# Patient Record
Sex: Female | Born: 1960 | Race: White | Hispanic: No | Smoking: Current every day smoker
Health system: Southern US, Community
[De-identification: ages and names within clinical notes are randomized; demographics above are authoritative.]

## PROBLEM LIST (undated history)

## (undated) DIAGNOSIS — I10 Essential (primary) hypertension: Secondary | ICD-10-CM

## (undated) DIAGNOSIS — R0602 Shortness of breath: Secondary | ICD-10-CM

## (undated) DIAGNOSIS — F32A Depression, unspecified: Secondary | ICD-10-CM

## (undated) DIAGNOSIS — I219 Acute myocardial infarction, unspecified: Secondary | ICD-10-CM

## (undated) DIAGNOSIS — F329 Major depressive disorder, single episode, unspecified: Secondary | ICD-10-CM

## (undated) DIAGNOSIS — I639 Cerebral infarction, unspecified: Secondary | ICD-10-CM

## (undated) DIAGNOSIS — J40 Bronchitis, not specified as acute or chronic: Secondary | ICD-10-CM

## (undated) DIAGNOSIS — E7800 Pure hypercholesterolemia, unspecified: Secondary | ICD-10-CM

## (undated) DIAGNOSIS — G43909 Migraine, unspecified, not intractable, without status migrainosus: Secondary | ICD-10-CM

---
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
## 1980-08-02 HISTORY — PX: DILATION AND CURETTAGE OF UTERUS: SHX78

## 2010-09-24 DIAGNOSIS — I639 Cerebral infarction, unspecified: Secondary | ICD-10-CM

## 2010-09-24 HISTORY — DX: Cerebral infarction, unspecified: I63.9

## 2011-09-03 DIAGNOSIS — I219 Acute myocardial infarction, unspecified: Secondary | ICD-10-CM

## 2011-09-03 HISTORY — DX: Acute myocardial infarction, unspecified: I21.9

## 2011-09-26 DIAGNOSIS — I214 Non-ST elevation (NSTEMI) myocardial infarction: Secondary | ICD-10-CM

## 2011-09-26 DIAGNOSIS — R7989 Other specified abnormal findings of blood chemistry: Secondary | ICD-10-CM

## 2011-09-27 ENCOUNTER — Encounter (HOSPITAL_COMMUNITY): Payer: Self-pay | Admitting: Pharmacy Technician

## 2011-09-27 DIAGNOSIS — I219 Acute myocardial infarction, unspecified: Secondary | ICD-10-CM

## 2011-09-27 HISTORY — PX: CARDIAC CATHETERIZATION: SHX172

## 2011-09-28 ENCOUNTER — Ambulatory Visit (HOSPITAL_COMMUNITY)
Admission: RE | Admit: 2011-09-28 | Discharge: 2011-09-29 | Disposition: A | Discharge status: Home / Self Care | Payer: Self-pay | Source: Ambulatory Visit | Attending: Cardiology | Admitting: Cardiology

## 2011-09-28 ENCOUNTER — Encounter (HOSPITAL_COMMUNITY): Payer: Self-pay | Admitting: General Practice

## 2011-09-28 ENCOUNTER — Inpatient Hospital Stay (HOSPITAL_COMMUNITY)
Admission: RE | Admit: 2011-09-28 | Payer: Self-pay | Source: Other Acute Inpatient Hospital | Admitting: Cardiovascular Disease

## 2011-09-28 ENCOUNTER — Encounter (HOSPITAL_COMMUNITY)
Admission: RE | Disposition: A | Discharge status: Home / Self Care | Payer: Self-pay | Source: Ambulatory Visit | Attending: Cardiology

## 2011-09-28 ENCOUNTER — Other Ambulatory Visit: Payer: Self-pay

## 2011-09-28 DIAGNOSIS — I2510 Atherosclerotic heart disease of native coronary artery without angina pectoris: Secondary | ICD-10-CM

## 2011-09-28 DIAGNOSIS — I214 Non-ST elevation (NSTEMI) myocardial infarction: Secondary | ICD-10-CM | POA: Insufficient documentation

## 2011-09-28 DIAGNOSIS — D72829 Elevated white blood cell count, unspecified: Secondary | ICD-10-CM | POA: Insufficient documentation

## 2011-09-28 DIAGNOSIS — I5181 Takotsubo syndrome: Secondary | ICD-10-CM | POA: Insufficient documentation

## 2011-09-28 DIAGNOSIS — I959 Hypotension, unspecified: Secondary | ICD-10-CM | POA: Insufficient documentation

## 2011-09-28 DIAGNOSIS — F17200 Nicotine dependence, unspecified, uncomplicated: Secondary | ICD-10-CM | POA: Insufficient documentation

## 2011-09-28 DIAGNOSIS — N179 Acute kidney failure, unspecified: Secondary | ICD-10-CM | POA: Insufficient documentation

## 2011-09-28 DIAGNOSIS — Z720 Tobacco use: Secondary | ICD-10-CM

## 2011-09-28 DIAGNOSIS — I10 Essential (primary) hypertension: Secondary | ICD-10-CM | POA: Insufficient documentation

## 2011-09-28 DIAGNOSIS — Z8673 Personal history of transient ischemic attack (TIA), and cerebral infarction without residual deficits: Secondary | ICD-10-CM | POA: Insufficient documentation

## 2011-09-28 DIAGNOSIS — E860 Dehydration: Secondary | ICD-10-CM | POA: Insufficient documentation

## 2011-09-28 DIAGNOSIS — G43909 Migraine, unspecified, not intractable, without status migrainosus: Secondary | ICD-10-CM

## 2011-09-28 HISTORY — DX: Essential (primary) hypertension: I10

## 2011-09-28 HISTORY — DX: Depression, unspecified: F32.A

## 2011-09-28 HISTORY — DX: Migraine, unspecified, not intractable, without status migrainosus: G43.909

## 2011-09-28 HISTORY — DX: Bronchitis, not specified as acute or chronic: J40

## 2011-09-28 HISTORY — DX: Major depressive disorder, single episode, unspecified: F32.9

## 2011-09-28 HISTORY — PX: LEFT HEART CATHETERIZATION WITH CORONARY ANGIOGRAM: SHX5451

## 2011-09-28 HISTORY — DX: Shortness of breath: R06.02

## 2011-09-28 HISTORY — DX: Acute myocardial infarction, unspecified: I21.9

## 2011-09-28 HISTORY — DX: Cerebral infarction, unspecified: I63.9

## 2011-09-28 HISTORY — DX: Pure hypercholesterolemia, unspecified: E78.00

## 2011-09-28 LAB — CBC
Hemoglobin: 11.5 g/dL — ABNORMAL LOW (ref 12.0–15.0)
MCHC: 34.1 g/dL (ref 30.0–36.0)
Platelets: 260 10*3/uL (ref 150–400)
RBC: 3.96 MIL/uL (ref 3.87–5.11)

## 2011-09-28 LAB — CREATININE, SERUM
Creatinine, Ser: 0.85 mg/dL (ref 0.50–1.10)
GFR calc non Af Amer: 79 mL/min — ABNORMAL LOW (ref >90)

## 2011-09-28 SURGERY — LEFT HEART CATHETERIZATION WITH CORONARY ANGIOGRAM
Anesthesia: LOCAL

## 2011-09-28 MED ORDER — ALPRAZOLAM 0.25 MG PO TABS: 0.2500 mg | ORAL_TABLET | Freq: Two times a day (BID) | ORAL | Status: DC | PRN

## 2011-09-28 MED ORDER — LIDOCAINE HCL (PF) 1 % IJ SOLN
INTRAMUSCULAR | Status: AC
  Filled 2011-09-28: qty 30

## 2011-09-28 MED ORDER — ZOLPIDEM TARTRATE 5 MG PO TABS
10.0000 mg | ORAL_TABLET | Freq: Every evening | ORAL | Status: DC | PRN
  Administered 2011-09-28: 10 mg via ORAL
  Filled 2011-09-28: qty 2

## 2011-09-28 MED ORDER — HEPARIN SODIUM (PORCINE) 5000 UNIT/ML IJ SOLN
5000.0000 [IU] | Freq: Three times a day (TID) | INTRAMUSCULAR | Status: DC
  Filled 2011-09-28 (×5): qty 1

## 2011-09-28 MED ORDER — HYDROCODONE-ACETAMINOPHEN 5-325 MG PO TABS
1.0000 | ORAL_TABLET | Freq: Four times a day (QID) | ORAL | Status: DC | PRN
  Administered 2011-09-28 (×2): 1 via ORAL
  Filled 2011-09-28 (×2): qty 1

## 2011-09-28 MED ORDER — FENTANYL CITRATE 0.05 MG/ML IJ SOLN
INTRAMUSCULAR | Status: AC
  Filled 2011-09-28: qty 2

## 2011-09-28 MED ORDER — HEPARIN (PORCINE) IN NACL 2-0.9 UNIT/ML-% IJ SOLN
INTRAMUSCULAR | Status: AC
  Filled 2011-09-28: qty 2000

## 2011-09-28 MED ORDER — KETOROLAC TROMETHAMINE 30 MG/ML IJ SOLN
30.0000 mg | Freq: Once | INTRAMUSCULAR | Status: AC
  Administered 2011-09-28: 30 mg via INTRAVENOUS
  Filled 2011-09-28: qty 1

## 2011-09-28 MED ORDER — ACETAMINOPHEN 325 MG PO TABS
650.0000 mg | ORAL_TABLET | ORAL | Status: DC | PRN
  Administered 2011-09-29: 650 mg via ORAL
  Filled 2011-09-28: qty 2

## 2011-09-28 MED ORDER — NITROGLYCERIN 0.2 MG/ML ON CALL CATH LAB
INTRAVENOUS | Status: AC
  Filled 2011-09-28: qty 1

## 2011-09-28 MED ORDER — ASPIRIN 81 MG PO CHEW
81.0000 mg | CHEWABLE_TABLET | Freq: Every day | ORAL | Status: DC
  Administered 2011-09-29: 81 mg via ORAL
  Filled 2011-09-28: qty 1

## 2011-09-28 MED ORDER — ATORVASTATIN CALCIUM 10 MG PO TABS
20.0000 mg | ORAL_TABLET | Freq: Every day | ORAL | Status: DC
  Administered 2011-09-28: 20 mg via ORAL
  Filled 2011-09-28 (×2): qty 2

## 2011-09-28 MED ORDER — ONDANSETRON HCL 4 MG/2ML IJ SOLN: 4.0000 mg | Freq: Four times a day (QID) | INTRAMUSCULAR | Status: DC | PRN

## 2011-09-28 MED ORDER — VERAPAMIL HCL 2.5 MG/ML IV SOLN
INTRAVENOUS | Status: AC
  Filled 2011-09-28: qty 2

## 2011-09-28 MED ORDER — SODIUM CHLORIDE 0.9 % IV SOLN
INTRAVENOUS | Status: AC
Start: 2011-09-28 — End: 2011-09-28
  Administered 2011-09-28: 15:00:00 via INTRAVENOUS

## 2011-09-28 MED ORDER — ACETAMINOPHEN 325 MG PO TABS: 650.0000 mg | ORAL_TABLET | ORAL | Status: DC | PRN

## 2011-09-28 MED ORDER — HYDROCODONE-ACETAMINOPHEN 5-325 MG PO TABS: 1.0000 | ORAL_TABLET | Freq: Four times a day (QID) | ORAL | Status: DC

## 2011-09-28 MED ORDER — ACETAMINOPHEN 325 MG PO TABS
ORAL_TABLET | ORAL | Status: AC
  Filled 2011-09-28: qty 2

## 2011-09-28 MED ORDER — POTASSIUM CHLORIDE CRYS ER 20 MEQ PO TBCR
20.0000 meq | EXTENDED_RELEASE_TABLET | Freq: Two times a day (BID) | ORAL | Status: DC
  Administered 2011-09-28 – 2011-09-29 (×3): 20 meq via ORAL
  Filled 2011-09-28 (×4): qty 1

## 2011-09-28 MED ORDER — CARVEDILOL 6.25 MG PO TABS
6.2500 mg | ORAL_TABLET | Freq: Two times a day (BID) | ORAL | Status: DC
  Administered 2011-09-28 – 2011-09-29 (×2): 6.25 mg via ORAL
  Filled 2011-09-28 (×4): qty 1

## 2011-09-28 MED ORDER — HEPARIN SODIUM (PORCINE) 1000 UNIT/ML IJ SOLN
INTRAMUSCULAR | Status: AC
  Filled 2011-09-28: qty 1

## 2011-09-28 MED ORDER — LISINOPRIL 2.5 MG PO TABS
2.5000 mg | ORAL_TABLET | Freq: Every day | ORAL | Status: DC
  Administered 2011-09-29: 2.5 mg via ORAL
  Filled 2011-09-28 (×2): qty 1

## 2011-09-28 MED ORDER — MIDAZOLAM HCL 2 MG/2ML IJ SOLN
INTRAMUSCULAR | Status: AC
  Filled 2011-09-28: qty 2

## 2011-09-28 NOTE — Procedures (Addendum)
   Cardiac Catheterization Procedure Note  Name: Ashley Arroyo MRN: [id number] DOB: 1961-02-13  Procedure: Left Heart Cath, Selective Coronary Angiography, LV angiography  Indication: NSTEMI   Procedural Details: Allen's test was positive.  The right wrist was prepped, draped, and anesthetized with 1% lidocaine. Using the modified Seldinger technique, a 5 French sheath was introduced into the right radial artery. 3 mg of verapamil was administered through the sheath, weight-based unfractionated heparin was administered intravenously. Standard Judkins catheters were used for selective coronary angiography and left ventriculography. Catheter exchanges were performed over an exchange length guidewire. There were no immediate procedural complications. A TR band was used for radial hemostasis at the completion of the procedure.  The patient was transferred to the post catheterization recovery area for further monitoring.  Procedural Findings: Hemodynamics: AO 159/89 LV 155/15  Coronary angiography: Coronary dominance: right  Left mainstem: Mild distal LM tapering.   Left anterior descending (LAD): 30% proximal stenosis, serial 30% mid LAD stenoses.   Left circumflex (LCx): Small ramus, large OM1.  30% proximal LCx stenosis.   Right coronary artery (RCA): 30% proximal and 40% mid RCA stenosis.   Left ventriculography: Left ventricular systolic function is mild to moderately reduced, EF 40-45%.  Global hypokinesis.  No mitral regurgitation.  Final Conclusions:  Nonobstructive CAD.  EF 40-45% with mild global hypokinesis.   Recommendations:  Nonischemic cardiomyopathy.  EF improved compared to echo report from [place].  ? If this could be a Takotsubo-type response to receiving epinephrine at [facility] at hospital admission (for stridor and hypotension).  Probable discharge tomorrow if stable.  Will repeat ECG to make sure that QT interval remains normal.   Marca Ancona 09/28/2011, 12:41  PM

## 2011-09-28 NOTE — H&P (Signed)
   Please see written progress note today from Dr. Kirke Corin at [place].  No changes to H&P since that time.  Proceeding with left heart cath.   Marca Ancona 09/28/2011

## 2011-09-29 ENCOUNTER — Other Ambulatory Visit: Payer: Self-pay

## 2011-09-29 DIAGNOSIS — I10 Essential (primary) hypertension: Secondary | ICD-10-CM

## 2011-09-29 DIAGNOSIS — I214 Non-ST elevation (NSTEMI) myocardial infarction: Secondary | ICD-10-CM

## 2011-09-29 DIAGNOSIS — N179 Acute kidney failure, unspecified: Secondary | ICD-10-CM

## 2011-09-29 DIAGNOSIS — Z720 Tobacco use: Secondary | ICD-10-CM

## 2011-09-29 DIAGNOSIS — I5181 Takotsubo syndrome: Secondary | ICD-10-CM

## 2011-09-29 LAB — CBC
Hemoglobin: 11.1 g/dL — ABNORMAL LOW (ref 12.0–15.0)
MCH: 28.6 pg (ref 26.0–34.0)
MCHC: 33.5 g/dL (ref 30.0–36.0)
Platelets: 252 10*3/uL (ref 150–400)

## 2011-09-29 LAB — BASIC METABOLIC PANEL
BUN: 8 mg/dL (ref 6–23)
Calcium: 9.2 mg/dL (ref 8.4–10.5)
GFR calc Af Amer: >90 mL/min (ref >90)
GFR calc non Af Amer: 82 mL/min — ABNORMAL LOW (ref >90)
Glucose, Bld: 92 mg/dL (ref 70–99)
Potassium: 3.8 mEq/L (ref 3.5–5.1)
Sodium: 136 mEq/L (ref 135–145)

## 2011-09-29 LAB — MAGNESIUM: Magnesium: 2 mg/dL (ref 1.5–2.5)

## 2011-09-29 MED ORDER — LISINOPRIL 2.5 MG PO TABS: 2.5000 mg | ORAL_TABLET | Freq: Every day | ORAL | Status: DC

## 2011-09-29 MED ORDER — CARVEDILOL 6.25 MG PO TABS: 6.2500 mg | ORAL_TABLET | Freq: Two times a day (BID) | ORAL | Status: DC

## 2011-09-29 MED ORDER — PRAVASTATIN SODIUM 40 MG PO TABS: 40.0000 mg | ORAL_TABLET | Freq: Every day | ORAL | Status: DC

## 2011-09-29 NOTE — Progress Notes (Signed)
   CARE MANAGEMENT NOTE 09/29/2011  Patient:  MAZEY, MANTELL   Account Number:  [id number]  Date Initiated:  09/29/2011  Documentation initiated by:  Sharrie Rothman  Subjective/Objective Assessment:   Pt admitted with CP, S/P Cath. Pt being discharged today and needs assistance with medications and PCp set up. CM consulted for this assistance. Spoke with PT and has not seen PCP > 1 year.     Action/Plan:   Called [facility] and made follow up appt with Kizzie Furnish and also with Meriam Sprague about assistance with medications. Pt made aware of follow up appt and directions for med assistance.   Anticipated DC Date:  09/29/2011   Anticipated DC Plan:        DC Planning Services  CM consult      Choice offered to / List presented to:             Status of service:  Completed, signed off Medicare Important Message given?   (If response is "NO", the following Medicare IM given date fields will be blank) Date Medicare IM given:   Date Additional Medicare IM given:    Discharge Disposition:  HOME/SELF CARE  Per UR Regulation:    Comments:  09-29-11 1053 Arlyss Queen, RN BSN Care Management Appt made for March 4 at 815. Pt also ask for paperwork for medication assistance. Also called financial advisor to come speak with pt about help with hospital bill.
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
## 2011-09-29 NOTE — Progress Notes (Signed)
pts BP 170s/100s earlier this AM; 6.25mg  coreg given around 0730; BP recheck around 0930 was 100/69; pt to receive lisinopril, paged Jessica Hope,Pa and made aware of BP;  told to give some time and recheck BP in a few hours, if pressure ok, then give lisinopril; will continue to monitor; pt without any complaints

## 2011-09-29 NOTE — Progress Notes (Signed)
I have seen and examined the patient. I agree with the above note with the addition of : No chest pain or dyspnea. Cardiac cath noted. 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  Radial site is intact. Can discharge home today on current meds. Follow up in 2 weeks with me.   Lorine Bears 09/29/2011 1:32 PM

## 2011-09-29 NOTE — Consult Note (Signed)
Pt is a 1 ppd smoker who is very eager to quit. Recommended 21 mg patch x 6 weeks, 14 mg patch x 2 weeks and the 7 mg patch x 2 weeks. Discussed and wrote down patch use instructions for the pt. Referred to 1-800 quit now for f/u and support. Discussed oral fixation substitutes, second hand smoke and in home smoking policy. Reviewed and gave pt Written education/contact information.

## 2011-09-29 NOTE — Discharge Instructions (Signed)
Appt made for March 4 at 815. Pt also ask for paperwork for medication assistance at [facility] Dept [phone]. Please bring copy of all house income and discharge instructions to her appt.

## 2011-09-29 NOTE — Progress Notes (Signed)
BP this AM 175/105.  Patient c/o headache.  Stated she normally takes lisinopril at 8AM daily at home.  Paged PA, Lucile Crater to request AM BP-affecting meds be given early.  Await return call.  Headache treated with Tylenol 650mg  PO per current orders.  Continuing to monitor.

## 2011-09-29 NOTE — Progress Notes (Signed)
Cardiology Progress Note Patient Name: Ashley Arroyo Date of Encounter: 09/29/2011, 8:26 AM     Subjective  No overnight events. Patient feels well this morning without c/o headache, chest pain, or shortness of breath. She lives in [place] with her mother. Feels ready to go home.   Objective   Telemetry: Sinus rhythm 70s-90s  EKG 09/29/11 @ 0658 - NSR 77bpm, QTc 447, more pronounced T waves V4-V6  Medications: . aspirin  81 mg Oral Daily  . atorvastatin  20 mg Oral q1800  . carvedilol  6.25 mg Oral BID WC  . heparin  5,000 Units Subcutaneous Q8H  . ketorolac  30 mg Intravenous Once  . lisinopril  2.5 mg Oral Daily  . potassium chloride  20 mEq Oral BID   . sodium chloride 75 mL/hr at 09/28/11 1526    Physical Exam: Temp:  [98 F (36.7 C)-99.5 F (37.5 C)] 98.4 F (36.9 C) (02/27 0807) Pulse Rate:  [75-96] 75  (02/27 0807) Resp:  [16-22] 22  (02/27 0807) BP: (110-177)/(76-105) 110/76 mmHg (02/27 0807) SpO2:  [95 %-98 %] 95 % (02/27 0807) Weight:  [110 lb (49.896 kg)-118 lb (53.524 kg)] 118 lb (53.524 kg) (02/27 0500)  General: Middle-aged white female, in no acute distress. Head: Normocephalic, atraumatic, sclera non-icteric, nares are without discharge.  Neck: Supple. Negative for carotid bruits or JVD Lungs: Clear bilaterally to auscultation without wheezes, rales, or rhonchi. Breathing is unlabored. Heart: RRR S1 S2 w/ 3/6 systolic murmur best heard @ LUSB, No rubs or gallops.  Abdomen: Soft, non-tender, non-distended with normoactive bowel sounds. No rebound/guarding. No obvious abdominal masses. Msk:  Strength and tone appear normal for age. Extremities: Right wrist dressing dry/intact. No hematoma. No edema. No clubbing or cyanosis. Distal pedal pulses are 2+ and equal bilaterally. Neuro: Alert and oriented X 3. Moves all extremities spontaneously. Psych:  Responds to questions appropriately with a flat affect.   Labs:  [facility] 09/29/11 0620 09/28/11 1506    NA 136 --  K 3.8 --  CL 102 --  CO2 27 --  GLUCOSE 92 --  BUN 8 --  CREATININE 0.82 0.85  CALCIUM 9.2 --  MG 2.0 --   Basename 09/29/11 0620 09/28/11 1506  WBC 10.0 12.8*  HGB 11.1* 11.5*  HCT 33.1* 33.7*  MCV 85.3 85.1  PLT 252 260   Radiology/Studies:   09/28/11 - Left Heart Cath, Selective Coronary Angiography, LV angiography Hemodynamics:  AO 159/89  LV 155/15  Coronary angiography:  Coronary dominance: right  Left mainstem: Mild distal LM tapering.  Left anterior descending (LAD): 30% proximal stenosis, serial 30% mid LAD stenoses.  Left circumflex (LCx): Small ramus, large OM1. 30% proximal LCx stenosis.  Right coronary artery (RCA): 30% proximal and 40% mid RCA stenosis.  Left ventriculography: Left ventricular systolic function is mild to moderately reduced, EF 40-45%. Global hypokinesis. No mitral regurgitation.  Final Conclusions: Nonobstructive CAD. EF 40-45% with mild global hypokinesis.  Recommendations: Nonischemic cardiomyopathy. EF improved compared to echo report from [place]. ? If this could be a Takotsubo-type response to receiving epinephrine at [facility] at hospital admission (for stridor and hypotension). Probable discharge tomorrow if stable. Will repeat ECG to make sure that QT interval remains normal.     Assessment and Plan  51 y.o. female w/ PMHx significant for tobacco abuse, HTN, TIA, and migraines who presented to [facility] for headache, vomiting, and weakness, found to have low EF on echo and ruled in for NSTEMI  for which she was transferred to [facility] on 09/28/11 for cardiac cath.   1. NSTEMI: Elevated cardiac enzymes initially felt 2/2 hypoperfusion in the setting of hypotension and vomiting. EF was noted to be 20-25% by echo at [facility], but 40-45% by cath here. Cardiac cath revealed nonobstructive CAD with mild global hypokinesis. There is question if this could be a Takotsubo-type response to receiving epinephrine at  [facility] for stridor. She denies chest pain. EKG this am is without any acute ischemic changes. QTc 447. Cont ASA, BB, statin.  2. Hypertension: She was initially hypotensive at [facility] and home antihypertensives were held. BPs have been elevated here with SBP 160-170 overnight. She takes Lisinopril 40mg  and Norvasc 5mh at home. Lisinopril 2.5mg  was added this morning. Last BP 110/76. Monitor BP and consider uptitration of ACEI. Cont Coreg  3. Leukocytosis: Resolved. WBC improved to 10.0 this am. Tmax 99.5  4. Acute kidney injury: Resolved. BUN/Crt improved to 8/0.82 this am  Disposition: MD to assess timing of DC. Note she is self pay and will need generic medications if possible.  Signed, Clatie Kessen PA-C

## 2011-09-29 NOTE — Progress Notes (Signed)
D/c orders received; ivs removed with gauze on; pt remains in stable condition; pt meds and instructions reviewed and given to pt; pt reminded of health department meeting on 10/04/11 for medication assistance; pt without any questions, pt d/c home

## 2011-09-30 ENCOUNTER — Other Ambulatory Visit: Payer: Self-pay | Admitting: Cardiology

## 2011-09-30 NOTE — Telephone Encounter (Signed)
Looks like this is Dr Jari Sportsman pt in [place].

## 2011-09-30 NOTE — Telephone Encounter (Signed)
Please advise 

## 2011-09-30 NOTE — Telephone Encounter (Signed)
Alprazolam was not called into [facility] [place], pt was in hospital and this one was the only one not called in

## 2011-09-30 NOTE — Telephone Encounter (Signed)
FU Call: pt calling to check on status of refill request for alprazolam 0.25 mg to [facility] in [place]

## 2011-09-30 NOTE — Telephone Encounter (Signed)
Pt states medications were changed while in the hospital and the Xanax was the only thing not changed. She is aware that Dr. Kirke Corin will need to review and approve before any prescriptions can be sent for Xanax. She is also aware that we generally have primary MD manage these types of medications.

## 2011-10-01 MED ORDER — ALPRAZOLAM 0.25 MG PO TABS: 0.2500 mg | ORAL_TABLET | Freq: Three times a day (TID) | ORAL | Status: DC | PRN

## 2011-10-01 NOTE — Telephone Encounter (Signed)
I can only provide her with 30 tablets without refills until she sees her PCP.

## 2011-10-01 NOTE — Telephone Encounter (Signed)
Pt aware.

## 2011-10-12 ENCOUNTER — Encounter: Payer: Self-pay | Admitting: Cardiovascular Disease

## 2011-10-12 ENCOUNTER — Ambulatory Visit (INDEPENDENT_AMBULATORY_CARE_PROVIDER_SITE_OTHER): Payer: Self-pay | Admitting: Cardiovascular Disease

## 2011-10-12 VITALS — BP 145/80 | HR 71 | Ht 67.0 in | Wt 113.0 lb

## 2011-10-12 DIAGNOSIS — Z720 Tobacco use: Secondary | ICD-10-CM

## 2011-10-12 DIAGNOSIS — F17200 Nicotine dependence, unspecified, uncomplicated: Secondary | ICD-10-CM

## 2011-10-12 DIAGNOSIS — I10 Essential (primary) hypertension: Secondary | ICD-10-CM

## 2011-10-12 DIAGNOSIS — I5181 Takotsubo syndrome: Secondary | ICD-10-CM

## 2011-10-12 MED ORDER — LISINOPRIL 10 MG PO TABS: 10.0000 mg | ORAL_TABLET | Freq: Every day | ORAL | Status: DC

## 2011-10-12 NOTE — Patient Instructions (Signed)
Your physician wants you to follow-up in: 4 months. You will receive a reminder letter in the mail one-two months in advance. If you don't receive a letter, please call our office to schedule the follow-up appointment. Increase Lisinopril to 10 mg daily. You may take 4 of your 2.5 mg tablets daily until gone, and then get new prescription filled for 10 mg tablets. A new prescription was sent to your pharmacy to reflect this change.

## 2011-10-20 ENCOUNTER — Telehealth: Payer: Self-pay | Admitting: *Deleted

## 2011-10-20 ENCOUNTER — Other Ambulatory Visit: Payer: Self-pay | Admitting: Cardiovascular Disease

## 2011-10-20 NOTE — Telephone Encounter (Signed)
Pt left message on voicemail stating she was seen on the 12th by Dr. Kirke Corin. She states 3 days later she was trying to move something and had a pain around her heart. She had pain for 2 days.   Pt states she went to a friend of her's to help her do some cleaning. She had moved something before she had had a heart pain in her chest on her (L) side. She states then she went to another friends house after that and had a sharp pain again in her chest. She states yesterday we was walking around and she thinks she went too far and got tired.  She states she has been out of work for a year b/c of the [employer] and she lost some of her strength. She states she was trying to get on disability, but also was thinking about going back to work. She didn't know what her limitations were. Pt notified that whoever treated her stroke should notify her regarding limitations from her stroke and disability.   Pt aware a note will be sent to Dr. Kirke Corin regarding chest pain. Pt is aware to go to the ER for continued or worsening chest pain.

## 2011-10-21 NOTE — Assessment & Plan Note (Signed)
I discussed with her the importance of smoking cessation. 

## 2011-10-21 NOTE — Telephone Encounter (Signed)
I don't think her chest pain is related to her heart condition. Her cardiac cath showed no significant CAD. She should follow up with her PCP to make her BP is not running high.

## 2011-10-21 NOTE — Assessment & Plan Note (Signed)
The patient had what seems to be stress-induced cardiomyopathy which was either induced by her physical illness or due to epinephrine. There are well described cases of stress-induced cardiomyopathy after receiving epinephrine. These cases usually resolves quickly and many of them are associated with significant QT prolongation similar to her situation. Her ejection fraction significantly improved from 25% on echo 40-45% on cath. She had no significant coronary artery disease. I recommend continuing treatment with Coreg. Her blood pressure has been running elevated and thus I will increase the dose of lisinopril to 10 mg once daily. She is to followup in 4 months.

## 2011-10-21 NOTE — Assessment & Plan Note (Signed)
Lisinopril will be increased today. I prefer to maximize treatment with Coreg and lisinopril and if possible weaning her off clonidine.
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
## 2011-10-22 NOTE — Telephone Encounter (Signed)
Pt notified and verbalized understanding.

## 2011-12-07 ENCOUNTER — Telehealth: Payer: Self-pay | Admitting: *Deleted

## 2011-12-07 NOTE — Telephone Encounter (Signed)
Pt left message on voicemail asking for a return call.  Pt states she has an MI in Feb. She states she is now having pain in the glands in her neck, like where the arteries are. They have been hurting since last Friday. She states every now and then she feels she can't get her breath and has to take a deep breath. She states she also has trouble breathing at night when she lays down. She denies chest pain, pressure or tightness. She denies sore throat, runny nose or other allergy/cold/sinus symptoms.   Pt notified to proceed to ER for further evaluation. Pt verbalized understanding.

## 2012-02-14 ENCOUNTER — Encounter: Payer: Self-pay | Admitting: Cardiology

## 2012-02-14 ENCOUNTER — Ambulatory Visit (INDEPENDENT_AMBULATORY_CARE_PROVIDER_SITE_OTHER): Payer: Self-pay | Admitting: Cardiology

## 2012-02-14 VITALS — BP 162/106 | HR 72 | Ht 67.0 in | Wt 107.0 lb

## 2012-02-14 DIAGNOSIS — F17200 Nicotine dependence, unspecified, uncomplicated: Secondary | ICD-10-CM

## 2012-02-14 DIAGNOSIS — I214 Non-ST elevation (NSTEMI) myocardial infarction: Secondary | ICD-10-CM

## 2012-02-14 DIAGNOSIS — I10 Essential (primary) hypertension: Secondary | ICD-10-CM

## 2012-02-14 DIAGNOSIS — Z720 Tobacco use: Secondary | ICD-10-CM

## 2012-02-14 DIAGNOSIS — I5181 Takotsubo syndrome: Secondary | ICD-10-CM

## 2012-02-14 MED ORDER — LISINOPRIL 20 MG PO TABS: 20.0000 mg | ORAL_TABLET | Freq: Every day | ORAL | Status: DC
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
## 2012-02-14 NOTE — Patient Instructions (Addendum)
Your physician recommends that you schedule a follow-up appointment in: August 2013 with Dr. Antoine Poche.  Your physician has requested that you have an echocardiogram in August 2013. Echocardiography is a painless test that uses sound waves to create images of your heart. It provides your doctor with information about the size and shape of your heart and how well your heart's chambers and valves are working. This procedure takes approximately one hour. There are no restrictions for this procedure.   Your physician has recommended you make the following change in your medication: increase lisinopril to 20 mg daily. You may take 2 of your 10 mg tablets until finished. Your new prescription has been sent to your pharmacy.   Your physician recommends that you return for lab work 02/28/12 @ [facility].

## 2012-03-01 ENCOUNTER — Encounter: Payer: Self-pay | Admitting: *Deleted

## 2012-03-08 ENCOUNTER — Other Ambulatory Visit: Payer: Self-pay

## 2012-03-08 ENCOUNTER — Other Ambulatory Visit (INDEPENDENT_AMBULATORY_CARE_PROVIDER_SITE_OTHER): Payer: Self-pay

## 2012-03-08 DIAGNOSIS — I5181 Takotsubo syndrome: Secondary | ICD-10-CM

## 2012-03-13 ENCOUNTER — Ambulatory Visit (INDEPENDENT_AMBULATORY_CARE_PROVIDER_SITE_OTHER): Payer: Self-pay | Admitting: Cardiology

## 2012-03-13 ENCOUNTER — Encounter: Payer: Self-pay | Admitting: Cardiology

## 2012-03-13 VITALS — BP 120/80 | HR 88 | Ht 67.0 in | Wt 105.5 lb

## 2012-03-13 DIAGNOSIS — I214 Non-ST elevation (NSTEMI) myocardial infarction: Secondary | ICD-10-CM

## 2012-03-13 DIAGNOSIS — I10 Essential (primary) hypertension: Secondary | ICD-10-CM

## 2012-03-13 DIAGNOSIS — Z720 Tobacco use: Secondary | ICD-10-CM

## 2012-03-13 DIAGNOSIS — I5181 Takotsubo syndrome: Secondary | ICD-10-CM

## 2012-03-13 DIAGNOSIS — F17200 Nicotine dependence, unspecified, uncomplicated: Secondary | ICD-10-CM

## 2012-03-13 MED ORDER — CARVEDILOL 12.5 MG PO TABS: 12.5000 mg | ORAL_TABLET | Freq: Two times a day (BID) | ORAL | Status: DC

## 2012-03-13 NOTE — Patient Instructions (Addendum)
Your physician recommends that you schedule a follow-up appointment in: 3 months with Dr. Antoine Poche. Your physician has recommended you make the following change in your medication:increase carvedilol to 12.5 mg twice daily. You may take 2 of your 6.25 mg twice daily until they finish. Your new prescription has been sent to your pharmacy.
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
## 2012-03-16 ENCOUNTER — Telehealth: Payer: Self-pay | Admitting: *Deleted

## 2012-03-16 NOTE — Telephone Encounter (Signed)
Message copied by Eustace Moore on Thu Mar 16, 2012  4:48 PM ------      Message from: Rollene Rotunda      Created: Sat Mar 11, 2012  4:12 PM       EF is better than the initial echocardiogram and unchanged from previous.  Mildly reduced EF. Continue with medical management.  Call Ms. Muldrow with the results and send results to MUSE,ROCHELLE D., PA

## 2012-03-16 NOTE — Telephone Encounter (Signed)
Left message with mother of patient to have patient call office.

## 2012-03-17 ENCOUNTER — Telehealth: Payer: Self-pay | Admitting: *Deleted

## 2012-03-17 NOTE — Telephone Encounter (Signed)
Patient informed and copy sent to [facility]

## 2012-03-17 NOTE — Telephone Encounter (Signed)
Message copied by Eustace Moore on Fri Mar 17, 2012  9:29 AM ------      Message from: Rollene Rotunda      Created: Sat Mar 11, 2012  4:12 PM       EF is better than the initial echocardiogram and unchanged from previous.  Mildly reduced EF. Continue with medical management.  Call Ms. Rutan with the results and send results to MUSE,ROCHELLE D., PA

## 2012-03-17 NOTE — Telephone Encounter (Signed)
Patient informed. 

## 2012-05-05 ENCOUNTER — Other Ambulatory Visit (HOSPITAL_COMMUNITY): Payer: Self-pay | Admitting: Cardiology

## 2012-06-13 ENCOUNTER — Encounter: Payer: Self-pay | Admitting: Cardiology

## 2012-06-13 ENCOUNTER — Ambulatory Visit (INDEPENDENT_AMBULATORY_CARE_PROVIDER_SITE_OTHER): Payer: Self-pay | Admitting: Cardiology

## 2012-06-13 VITALS — BP 158/112 | HR 75 | Ht 67.0 in | Wt 105.8 lb

## 2012-06-13 DIAGNOSIS — F17200 Nicotine dependence, unspecified, uncomplicated: Secondary | ICD-10-CM

## 2012-06-13 DIAGNOSIS — I5181 Takotsubo syndrome: Secondary | ICD-10-CM

## 2012-06-13 DIAGNOSIS — I10 Essential (primary) hypertension: Secondary | ICD-10-CM

## 2012-06-13 DIAGNOSIS — Z720 Tobacco use: Secondary | ICD-10-CM

## 2012-06-13 DIAGNOSIS — I214 Non-ST elevation (NSTEMI) myocardial infarction: Secondary | ICD-10-CM

## 2012-06-13 MED ORDER — CARVEDILOL 25 MG PO TABS: 25.0000 mg | ORAL_TABLET | Freq: Two times a day (BID) | ORAL | Status: DC
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
## 2012-06-13 NOTE — Patient Instructions (Addendum)
Your physician recommends that you schedule a follow-up appointment in: 2 months. Your physician has recommended you make the following change in your medication: Increased carvedilol to 25 mg twice daily. You may take (2) of your 12.5 mg twice daily until they are finished then start your new prescription. Your new prescription has been sent to your pharmacy. All other medications will remain the same.

## 2012-06-25 ENCOUNTER — Other Ambulatory Visit: Payer: Self-pay | Admitting: Cardiology

## 2012-08-07 ENCOUNTER — Ambulatory Visit (INDEPENDENT_AMBULATORY_CARE_PROVIDER_SITE_OTHER): Payer: Self-pay | Admitting: Cardiology

## 2012-08-07 ENCOUNTER — Encounter: Payer: Self-pay | Admitting: Cardiology

## 2012-08-07 VITALS — BP 115/74 | HR 74 | Ht 67.0 in | Wt 108.0 lb

## 2012-08-07 DIAGNOSIS — I214 Non-ST elevation (NSTEMI) myocardial infarction: Secondary | ICD-10-CM

## 2012-08-07 DIAGNOSIS — I10 Essential (primary) hypertension: Secondary | ICD-10-CM

## 2012-08-07 DIAGNOSIS — Z720 Tobacco use: Secondary | ICD-10-CM

## 2012-08-07 DIAGNOSIS — I5181 Takotsubo syndrome: Secondary | ICD-10-CM

## 2012-08-07 DIAGNOSIS — F17200 Nicotine dependence, unspecified, uncomplicated: Secondary | ICD-10-CM

## 2012-08-07 MED ORDER — PRAVASTATIN SODIUM 40 MG PO TABS: 40.0000 mg | ORAL_TABLET | Freq: Every day | ORAL | Status: DC

## 2012-08-07 NOTE — Patient Instructions (Addendum)
Your physician recommends that you schedule a follow-up appointment in: 1 year. You will receive a reminder letter in the mail in about 10 months reminding you to call and schedule your appointment. If you don't receive this letter, please contact our office. Your physician recommends that you continue on your current medications as directed. Please refer to the Current Medication list given to you today.  Your physician recommends that you return for a FASTING lipid profile in 8 months at [facility]. You will receive a reminder letter in the mail in about 6 months reminding you to have this completed. If you don't receive this letter, please contact our office.
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
## 2012-08-10 ENCOUNTER — Telehealth: Payer: Self-pay | Admitting: *Deleted

## 2012-08-10 NOTE — Telephone Encounter (Signed)
Recommend generic Lipitor 40 daily. Pt had an LDL of 134, back in 09/2011. She would then need a f/u FLP/LFT profile 12 weeks after starting Rx.

## 2012-08-10 NOTE — Telephone Encounter (Signed)
Patient called to let our office know that she can't afford the pravastatin since its no longer on $4 list. Nurse informed patient that different pharmacies would be contacted for price checks. [facility] has simvastatin available for $4. Can patient be switched to this. Patient said she can't afford $15 a month.

## 2012-08-11 NOTE — Telephone Encounter (Signed)
Called to verify information at [facility] and prices online aren't up to date. Per pharmacist #[id number] pravastatin 40 mg is $24.30 per month.

## 2012-08-11 NOTE — Telephone Encounter (Signed)
Pravastatin is available at [facility] for $15/3 months

## 2012-08-14 MED ORDER — SIMVASTATIN 20 MG PO TABS: 20.0000 mg | ORAL_TABLET | Freq: Every day | ORAL | Status: DC

## 2012-08-14 NOTE — Telephone Encounter (Signed)
Patient informed. 

## 2012-08-14 NOTE — Telephone Encounter (Signed)
Per Gene, patient to start on simvastatin 20 mg daily and repeat fasting labs as directed by Hochrein.

## 2012-08-14 NOTE — Telephone Encounter (Signed)
I recommend that she take whatever is most affordable for her. When she finds this, she can contact us and we'll call in prescription for her.

## 2012-08-15 ENCOUNTER — Other Ambulatory Visit: Payer: Self-pay | Admitting: *Deleted

## 2012-08-15 MED ORDER — SIMVASTATIN 20 MG PO TABS: 20.0000 mg | ORAL_TABLET | Freq: Every day | ORAL | Status: DC

## 2013-01-16 ENCOUNTER — Other Ambulatory Visit: Payer: Self-pay | Admitting: *Deleted

## 2013-01-16 DIAGNOSIS — Z13220 Encounter for screening for lipoid disorders: Secondary | ICD-10-CM

## 2013-01-16 NOTE — Progress Notes (Signed)
Order mailed to home address for fasting lipid lab.

## 2013-05-07 ENCOUNTER — Other Ambulatory Visit: Payer: Self-pay | Admitting: *Deleted

## 2013-05-07 MED ORDER — LISINOPRIL 20 MG PO TABS: 20.0000 mg | ORAL_TABLET | Freq: Every day | ORAL | Status: DC

## 2013-06-20 ENCOUNTER — Telehealth: Payer: Self-pay | Admitting: *Deleted

## 2013-06-20 DIAGNOSIS — Z79899 Other long term (current) drug therapy: Secondary | ICD-10-CM

## 2013-06-20 DIAGNOSIS — E785 Hyperlipidemia, unspecified: Secondary | ICD-10-CM

## 2013-06-20 NOTE — Telephone Encounter (Signed)
Spoke with patient and she informed nurse that she hasn't taken simvastatin since March 2014. Please advise if patient need to still increase dosage or restart simvastatin 20 mg.

## 2013-06-20 NOTE — Telephone Encounter (Signed)
Message copied by Eustace Moore on Wed Jun 20, 2013  1:47 PM ------      Message from: FLEMING, PAMELA J      Created: Mon Jun 04, 2013  2:18 PM                   ----- Message -----         From: Rollene Rotunda, MD         Sent: 06/03/2013   6:02 PM           To: Rocco Serene, RN            The patient can only afford Zocor.  Increase dose to 40 mg daily.  Call Ms. Bartles with the results and send results to MUSE,ROCHELLE D., PA-C             ------

## 2013-06-22 NOTE — Telephone Encounter (Signed)
Restart at 20 mg daily.  Repeat lipid and liver in 10 weeks.

## 2013-06-25 MED ORDER — SIMVASTATIN 20 MG PO TABS: 20.0000 mg | ORAL_TABLET | Freq: Every day | ORAL | Status: DC

## 2013-06-25 NOTE — Telephone Encounter (Signed)
Patient informed. Patient requesting to get lab work orders during her upcoming appointment on 08/13/13.

## 2013-07-09 ENCOUNTER — Other Ambulatory Visit: Payer: Self-pay | Admitting: *Deleted

## 2013-07-09 MED ORDER — CARVEDILOL 25 MG PO TABS: 25.0000 mg | ORAL_TABLET | Freq: Two times a day (BID) | ORAL | Status: DC

## 2013-07-16 ENCOUNTER — Other Ambulatory Visit: Payer: Self-pay | Admitting: *Deleted

## 2013-07-16 MED ORDER — CARVEDILOL 25 MG PO TABS: 25.0000 mg | ORAL_TABLET | Freq: Two times a day (BID) | ORAL | Status: DC

## 2013-08-13 ENCOUNTER — Encounter: Payer: Self-pay | Admitting: Cardiology

## 2013-08-13 ENCOUNTER — Ambulatory Visit (INDEPENDENT_AMBULATORY_CARE_PROVIDER_SITE_OTHER): Payer: Self-pay | Admitting: Cardiology

## 2013-08-13 VITALS — BP 94/61 | HR 71 | Ht 67.0 in | Wt 103.0 lb

## 2013-08-13 DIAGNOSIS — I10 Essential (primary) hypertension: Secondary | ICD-10-CM

## 2013-08-13 DIAGNOSIS — Z79899 Other long term (current) drug therapy: Secondary | ICD-10-CM

## 2013-08-13 DIAGNOSIS — I5181 Takotsubo syndrome: Secondary | ICD-10-CM

## 2013-08-13 DIAGNOSIS — E785 Hyperlipidemia, unspecified: Secondary | ICD-10-CM
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
## 2013-08-13 NOTE — Patient Instructions (Signed)
Your physician recommends that you schedule a follow-up appointment in: 1 year with Dr. Wyline MoodBranch. You should receive a letter in the mail in 10 months. If you do not receive this letter by November 2015  call our office to schedule this appointment.   Your physician recommends that you continue on your current medications as directed. Please refer to the Current Medication list given to you today.  Your physician recommends that you return for lab work in: today .   You can go to the following locations to get lab work done: [facility] [address] [place]. [place] [facility].

## 2013-08-23 ENCOUNTER — Telehealth: Payer: Self-pay | Admitting: *Deleted

## 2013-08-23 NOTE — Telephone Encounter (Signed)
Message copied by Eustace MooreANDERSON, Haden Suder M on Thu Aug 23, 2013  8:52 AM ------      Message from: Pickensville[place]ANCH, JONATHAN F      Created: Wed Aug 22, 2013 10:42 AM       Please let patient know that cholesterol looks good            Dina RichJonathan Branch MD ------

## 2013-08-23 NOTE — Telephone Encounter (Signed)
Patient informed. 

## 2013-10-03 ENCOUNTER — Encounter: Payer: Self-pay | Admitting: Cardiology

## 2013-12-28 ENCOUNTER — Other Ambulatory Visit: Payer: Self-pay | Admitting: *Deleted

## 2013-12-28 MED ORDER — LISINOPRIL 20 MG PO TABS: 20.0000 mg | ORAL_TABLET | Freq: Every day | ORAL | Status: DC

## 2014-02-19 ENCOUNTER — Telehealth: Payer: Self-pay | Admitting: Cardiology

## 2014-02-19 NOTE — Telephone Encounter (Signed)
Unclear whats causing her symptoms. From a cardiovascular standpoint does not sound consistent with circulation problems, it sounds more nerve related. Potenitally a neuropathy or lumbar back issue. This would need to get worked up by pcp first.  Ashley FerryJ Tanith Dagostino MD

## 2014-02-19 NOTE — Telephone Encounter (Signed)
Advised patient to see her PMD regarding these issues.  Stated that she did not have the money to pay for a visit at the health department.  Stated that she did have patient assistance with [facility].  Patient questioned how she could find out more regarding patient assistance with [facility].  Phone number provided.  Again, explained to her that this may not be a cardiac related issue.  Will send to MD for advice.

## 2014-02-20 NOTE — Telephone Encounter (Signed)
Patient notified

## 2014-07-11 ENCOUNTER — Encounter (HOSPITAL_COMMUNITY): Payer: Self-pay | Admitting: Cardiology

## 2014-07-31 ENCOUNTER — Other Ambulatory Visit: Payer: Self-pay | Admitting: Cardiology

## 2014-08-01 ENCOUNTER — Other Ambulatory Visit: Payer: Self-pay | Admitting: *Deleted

## 2014-08-01 MED ORDER — CARVEDILOL 25 MG PO TABS: 25.0000 mg | ORAL_TABLET | Freq: Two times a day (BID) | ORAL | Status: DC

## 2014-08-01 MED ORDER — LISINOPRIL 20 MG PO TABS: 20.0000 mg | ORAL_TABLET | Freq: Every day | ORAL | Status: DC

## 2014-08-05 ENCOUNTER — Other Ambulatory Visit: Payer: Self-pay | Admitting: *Deleted

## 2014-08-05 MED ORDER — CARVEDILOL 25 MG PO TABS: 25.0000 mg | ORAL_TABLET | Freq: Two times a day (BID) | ORAL | Status: DC

## 2014-08-05 MED ORDER — LISINOPRIL 20 MG PO TABS: 20.0000 mg | ORAL_TABLET | Freq: Every day | ORAL | Status: DC

## 2014-08-12 ENCOUNTER — Encounter: Payer: Self-pay | Admitting: Cardiology

## 2014-08-12 ENCOUNTER — Ambulatory Visit (INDEPENDENT_AMBULATORY_CARE_PROVIDER_SITE_OTHER): Payer: Self-pay | Admitting: Cardiology

## 2014-08-12 VITALS — BP 114/89 | HR 92 | Ht 67.0 in | Wt 106.0 lb

## 2014-08-12 DIAGNOSIS — R0602 Shortness of breath: Secondary | ICD-10-CM

## 2014-08-12 DIAGNOSIS — E785 Hyperlipidemia, unspecified: Secondary | ICD-10-CM

## 2014-08-12 DIAGNOSIS — I5022 Chronic systolic (congestive) heart failure: Secondary | ICD-10-CM

## 2014-08-12 DIAGNOSIS — Z720 Tobacco use: Secondary | ICD-10-CM

## 2014-08-12 DIAGNOSIS — I10 Essential (primary) hypertension: Secondary | ICD-10-CM

## 2014-08-12 MED ORDER — CARVEDILOL 25 MG PO TABS: 25.0000 mg | ORAL_TABLET | Freq: Two times a day (BID) | ORAL | Status: DC

## 2014-08-12 NOTE — Patient Instructions (Addendum)
Your physician wants you to follow-up in: 6 months with Dr. Lurena JoinerBranch You will receive a reminder letter in the mail two months in advance. If you don't receive a letter, please call our office to schedule the follow-up appointment.  Your physician has requested that you have an echocardiogram. Echocardiography is a painless test that uses sound waves to create images of your heart. It provides your doctor with information about the size and shape of your heart and how well your heart's chambers and valves are working. This procedure takes approximately one hour. There are no restrictions for this procedure.  Your physician recommends that you continue on your current medications as directed. Please refer to the Current Medication list given to you today.  Your physician has recommended that you have a pulmonary function test. Pulmonary Function Tests are a group of tests that measure how well air moves in and out of your lungs.  WE HAVE GIVEN YOU A 30 DAY SUPPLY OF CARVEDILOL   Thank you for choosing  [facility]!!
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
## 2014-08-14 ENCOUNTER — Other Ambulatory Visit: Payer: Self-pay

## 2014-08-14 DIAGNOSIS — R0602 Shortness of breath: Secondary | ICD-10-CM

## 2014-08-16 ENCOUNTER — Telehealth: Payer: Self-pay | Admitting: *Deleted

## 2014-08-16 ENCOUNTER — Ambulatory Visit (HOSPITAL_COMMUNITY)
Admission: RE | Admit: 2014-08-16 | Discharge: 2014-08-16 | Disposition: A | Discharge status: Home / Self Care | Payer: Self-pay | Source: Ambulatory Visit | Attending: Cardiology | Admitting: Cardiology

## 2014-08-16 DIAGNOSIS — R0602 Shortness of breath: Secondary | ICD-10-CM | POA: Insufficient documentation

## 2014-08-16 DIAGNOSIS — F17210 Nicotine dependence, cigarettes, uncomplicated: Secondary | ICD-10-CM | POA: Insufficient documentation

## 2014-08-16 LAB — PULMONARY FUNCTION TEST
DL/VA % pred: 65 %
DL/VA: 3.38 ml/min/mmHg/L
DLCO cor % pred: 50 %
DLCO cor: 14.21 ml/min/mmHg
DLCO unc % pred: 50 %
DLCO unc: 14.21 ml/min/mmHg
FEF 25-75 PRE: 1.28 L/s
FEF 25-75 Post: 1.02 L/sec
FEF2575-%Change-Post: -20 %
FEF2575-%PRED-PRE: 45 %
FEF2575-%Pred-Post: 36 %
FEV1-%CHANGE-POST: 2 %
FEV1-%PRED-POST: 70 %
FEV1-%PRED-PRE: 68 %
FEV1-POST: 2.14 L
FEV1-Pre: 2.08 L
FEV1FVC-%CHANGE-POST: 7 %
FEV1FVC-%PRED-PRE: 80 %
FEV6-%Change-Post: -3 %
FEV6-%PRED-POST: 84 %
FEV6-%PRED-PRE: 87 %
FEV6-Post: 3.14 L
FEV6-Pre: 3.27 L
FEV6FVC-%CHANGE-POST: 0 %
FEV6FVC-%Pred-Post: 103 %
FEV6FVC-%Pred-Pre: 102 %
FVC-%CHANGE-POST: -4 %
FVC-%Pred-Post: 81 %
FVC-%Pred-Pre: 85 %
FVC-PRE: 3.29 L
FVC-Post: 3.15 L
POST FEV6/FVC RATIO: 100 %
PRE FEV6/FVC RATIO: 99 %
Post FEV1/FVC ratio: 68 %
Pre FEV1/FVC ratio: 63 %
RV % PRED: 139 %
RV: 2.79 L
TLC % pred: 101 %
TLC: 5.57 L

## 2014-08-16 MED ORDER — ALBUTEROL SULFATE (2.5 MG/3ML) 0.083% IN NEBU
2.5000 mg | INHALATION_SOLUTION | Freq: Once | RESPIRATORY_TRACT | Status: AC
  Administered 2014-08-16: 2.5 mg via RESPIRATORY_TRACT

## 2014-08-16 NOTE — Telephone Encounter (Signed)
-----   Message from Antoine PocheJonathan F Branch, MD sent at 08/15/2014  4:55 PM EST ----- Heart function looks good, does not appear her SOB is related. We will f/u her breathing tests.   Dominga FerryJ Branch MD

## 2014-08-16 NOTE — Telephone Encounter (Signed)
Pt made aware, will forward to Dr. Ledell PeoplesMuse

## 2014-08-27 ENCOUNTER — Telehealth: Payer: Self-pay | Admitting: *Deleted

## 2014-08-27 MED ORDER — ALBUTEROL SULFATE HFA 108 (90 BASE) MCG/ACT IN AERS: 2.0000 | INHALATION_SPRAY | Freq: Four times a day (QID) | RESPIRATORY_TRACT | Status: DC | PRN

## 2014-08-27 MED ORDER — TIOTROPIUM BROMIDE MONOHYDRATE 18 MCG IN CAPS: 18.0000 ug | ORAL_CAPSULE | Freq: Every day | RESPIRATORY_TRACT | Status: DC

## 2014-08-27 NOTE — Telephone Encounter (Signed)
-----   Message from Antoine PocheJonathan F Branch, MD sent at 08/27/2014 10:39 AM EST ----- Lung tests show she does have some evidence of COPD. Please forward results to pcp and start spiriva 18mcg inhaled qday and albuterol inhaler prn   Dominga FerryJ Branch MD

## 2014-08-27 NOTE — Telephone Encounter (Signed)
Pt mother made aware, forwarded to Dr. Ledell PeoplesMuse. Inhalers sent to pharmacy

## 2014-08-27 NOTE — Telephone Encounter (Signed)
Pt called [facility] regarding inhalers and cost is $300. Pt does not have insurance or job at this time and cannot afford medication. Pt wanted to make Dr. Wyline MoodBranch aware. Will forward to Dr. Wyline MoodBranch

## 2014-08-28 NOTE — Telephone Encounter (Signed)
Ok, that is probably the spiriva that is that expensive. If she is interested can try just the albuterol prn, that should not be expensive   Dominga FerryJ Benjiman Sedgwick MD

## 2014-08-28 NOTE — Telephone Encounter (Signed)
Pt mother made aware, pt will call back if cannot afford albuterol

## 2014-11-04 ENCOUNTER — Other Ambulatory Visit: Payer: Self-pay | Admitting: Cardiology

## 2015-01-31 ENCOUNTER — Encounter: Payer: Self-pay | Admitting: *Deleted

## 2015-02-06 ENCOUNTER — Ambulatory Visit (INDEPENDENT_AMBULATORY_CARE_PROVIDER_SITE_OTHER): Payer: Self-pay | Admitting: Cardiology

## 2015-02-06 VITALS — BP 83/53 | HR 78 | Ht 67.0 in | Wt 108.0 lb

## 2015-02-06 DIAGNOSIS — R7309 Other abnormal glucose: Secondary | ICD-10-CM

## 2015-02-06 DIAGNOSIS — I10 Essential (primary) hypertension: Secondary | ICD-10-CM

## 2015-02-06 DIAGNOSIS — E785 Hyperlipidemia, unspecified: Secondary | ICD-10-CM

## 2015-02-06 DIAGNOSIS — R5383 Other fatigue: Secondary | ICD-10-CM

## 2015-02-06 DIAGNOSIS — Z5181 Encounter for therapeutic drug level monitoring: Secondary | ICD-10-CM

## 2015-02-06 DIAGNOSIS — J449 Chronic obstructive pulmonary disease, unspecified: Secondary | ICD-10-CM

## 2015-02-06 MED ORDER — LISINOPRIL 10 MG PO TABS: 10.0000 mg | ORAL_TABLET | Freq: Every day | ORAL | Status: DC

## 2015-02-06 MED ORDER — TIOTROPIUM BROMIDE MONOHYDRATE 18 MCG IN CAPS: 18.0000 ug | ORAL_CAPSULE | Freq: Every day | RESPIRATORY_TRACT | Status: DC

## 2015-02-06 MED ORDER — LISINOPRIL 10 MG PO TABS
10.0000 mg | ORAL_TABLET | Freq: Every day | ORAL | Status: DC
Start: 2015-02-06 — End: 2015-02-06
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
## 2015-02-06 NOTE — Patient Instructions (Addendum)
Your physician has recommended you make the following change in your medication:  Decrease lisinopril to 10 mg daily. You may break your 20 mg tablet in half daily until they are finished. Continue all other medications the same. Your physician recommends that you have FASTING lipid, cmet, cbc, tsh, hgA1c, mg levels done. Please fast for at least 8 hours. Your physician recommends that you schedule a follow-up appointment in: 4 months. You will receive a reminder letter in the mail in about 2 months reminding you to call and schedule your appointment. If you don't receive this letter, please contact our office.

## 2015-02-08 ENCOUNTER — Encounter: Payer: Self-pay | Admitting: Cardiology

## 2015-02-18 ENCOUNTER — Telehealth: Payer: Self-pay | Admitting: *Deleted

## 2015-02-18 NOTE — Telephone Encounter (Signed)
Pt aware, routed to pcp 

## 2015-02-18 NOTE — Telephone Encounter (Signed)
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 Needs to work on diet, exercise, and weight loss. No med changes at this time.   Dominga FerryJ Branch MD

## 2015-06-13 ENCOUNTER — Encounter: Payer: Self-pay | Admitting: Cardiology

## 2015-06-13 ENCOUNTER — Ambulatory Visit: Payer: Self-pay | Admitting: Cardiology

## 2015-06-13 DIAGNOSIS — R0989 Other specified symptoms and signs involving the circulatory and respiratory systems: Secondary | ICD-10-CM
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
## 2015-06-18 ENCOUNTER — Ambulatory Visit (INDEPENDENT_AMBULATORY_CARE_PROVIDER_SITE_OTHER): Payer: Self-pay | Admitting: Cardiology

## 2015-06-18 ENCOUNTER — Encounter: Payer: Self-pay | Admitting: Cardiology

## 2015-06-18 VITALS — BP 133/89 | HR 88 | Ht 67.0 in | Wt 113.0 lb

## 2015-06-18 DIAGNOSIS — E785 Hyperlipidemia, unspecified: Secondary | ICD-10-CM

## 2015-06-18 DIAGNOSIS — I10 Essential (primary) hypertension: Secondary | ICD-10-CM

## 2015-06-18 DIAGNOSIS — I5181 Takotsubo syndrome: Secondary | ICD-10-CM

## 2015-06-18 MED ORDER — RANITIDINE HCL 150 MG PO TABS: 150.0000 mg | ORAL_TABLET | Freq: Two times a day (BID) | ORAL | Status: DC

## 2015-06-18 NOTE — Patient Instructions (Signed)
   Begin Zantac 150mg  twice a day  - may buy over the counter Continue all other medications.   Your physician wants you to follow up in:  1 year.  You will receive a reminder letter in the mail one-two months in advance.  If you don't receive a letter, please call our office to schedule the follow up appointment
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
## 2015-08-17 ENCOUNTER — Other Ambulatory Visit: Payer: Self-pay | Admitting: Cardiology

## 2015-11-01 ENCOUNTER — Other Ambulatory Visit: Payer: Self-pay | Admitting: Cardiology

## 2016-01-11 ENCOUNTER — Other Ambulatory Visit: Payer: Self-pay | Admitting: Cardiology

## 2016-09-09 ENCOUNTER — Other Ambulatory Visit: Payer: Self-pay | Admitting: Cardiology

## 2016-12-18 ENCOUNTER — Other Ambulatory Visit: Payer: Self-pay | Admitting: Cardiology

## 2017-01-07 ENCOUNTER — Telehealth: Payer: Self-pay | Admitting: Cardiology

## 2017-01-07 MED ORDER — CARVEDILOL 25 MG PO TABS: 25.0000 mg | ORAL_TABLET | Freq: Two times a day (BID) | ORAL | 0 refills | Status: DC

## 2017-01-07 NOTE — Telephone Encounter (Signed)
#  30 Coreg 25 mg escribed to pharmacy

## 2017-01-07 NOTE — Telephone Encounter (Signed)
. °*  STAT* If patient is at the pharmacy, call can be transferred to refill team.   1. Which medications need to be refilled? (please list name of each medication and dose if known)  carvedilol (COREG) 25 MG tablet [[id number]][[id number]]   2. Which pharmacy/location (including street and city if local pharmacy) is medication to be sent to? [facility]  3. Do they need a 30 day or 90 day supply?   She just needs enough to last till her apt next Wed

## 2017-01-12 ENCOUNTER — Ambulatory Visit (INDEPENDENT_AMBULATORY_CARE_PROVIDER_SITE_OTHER): Payer: Self-pay | Admitting: Cardiology

## 2017-01-12 ENCOUNTER — Other Ambulatory Visit: Payer: Self-pay | Admitting: Cardiology

## 2017-01-12 ENCOUNTER — Encounter: Payer: Self-pay | Admitting: Cardiology

## 2017-01-12 VITALS — BP 132/86 | HR 65 | Ht 67.0 in | Wt 96.6 lb

## 2017-01-12 DIAGNOSIS — R1310 Dysphagia, unspecified: Secondary | ICD-10-CM

## 2017-01-12 DIAGNOSIS — I429 Cardiomyopathy, unspecified: Secondary | ICD-10-CM

## 2017-01-12 DIAGNOSIS — I10 Essential (primary) hypertension: Secondary | ICD-10-CM

## 2017-01-12 DIAGNOSIS — E782 Mixed hyperlipidemia: Secondary | ICD-10-CM
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
## 2017-01-12 NOTE — Patient Instructions (Signed)
Medication Instructions:  Your physician recommends that you continue on your current medications as directed. Please refer to the Current Medication list given to you today.   Labwork: ASAP  Testing/Procedures: NONE  Follow-Up: Your physician wants you to follow-up in: 1 YEAR.  You will receive a reminder letter in the mail two months in advance. If you don't receive a letter, please call our office to schedule the follow-up appointment.   Any Other Special Instructions Will Be Listed Below (If Applicable). You have been referred to GI- Someone will contact you to schedule an appointment soon.      If you need a refill on your cardiac medications before your next appointment, please call your pharmacy.

## 2017-01-19 ENCOUNTER — Encounter: Payer: Self-pay | Admitting: Gastroenterology

## 2017-01-20 ENCOUNTER — Other Ambulatory Visit: Payer: Self-pay | Admitting: Cardiology

## 2017-01-26 LAB — COMPREHENSIVE METABOLIC PANEL
ALT: 8 U/L (ref 6–29)
AST: 16 U/L (ref 10–35)
Albumin: 4.2 g/dL (ref 3.6–5.1)
Alkaline Phosphatase: 87 U/L (ref 33–130)
BUN: 12 mg/dL (ref 7–25)
CALCIUM: 9.5 mg/dL (ref 8.6–10.4)
CO2: 26 mmol/L (ref 20–31)
Chloride: 105 mmol/L (ref 98–110)
Creat: 0.98 mg/dL (ref 0.50–1.05)
Glucose, Bld: 94 mg/dL (ref 65–99)
Potassium: 4.3 mmol/L (ref 3.5–5.3)
Sodium: 138 mmol/L (ref 135–146)
TOTAL PROTEIN: 6.8 g/dL (ref 6.1–8.1)
Total Bilirubin: 0.5 mg/dL (ref 0.2–1.2)

## 2017-01-26 LAB — CBC WITH DIFFERENTIAL/PLATELET
BASOS PCT: 0 %
Basophils Absolute: 0 cells/uL (ref 0–200)
EOS ABS: 128 {cells}/uL (ref 15–500)
Eosinophils Relative: 1 %
HEMATOCRIT: 45.5 % — AB (ref 35.0–45.0)
HEMOGLOBIN: 14.9 g/dL (ref 11.7–15.5)
Lymphocytes Relative: 20 %
Lymphs Abs: 2560 cells/uL (ref 850–3900)
MCH: 28.5 pg (ref 27.0–33.0)
MCHC: 32.7 g/dL (ref 32.0–36.0)
MCV: 87.2 fL (ref 80.0–100.0)
MONO ABS: 896 {cells}/uL (ref 200–950)
MPV: 9.9 fL (ref 7.5–12.5)
Monocytes Relative: 7 %
NEUTROS ABS: 9216 {cells}/uL — AB (ref 1500–7800)
Neutrophils Relative %: 72 %
Platelets: 239 10*3/uL (ref 140–400)
RBC: 5.22 MIL/uL — ABNORMAL HIGH (ref 3.80–5.10)
RDW: 13.9 % (ref 11.0–15.0)
WBC: 12.8 10*3/uL — ABNORMAL HIGH (ref 3.8–10.8)

## 2017-01-26 LAB — LIPID PANEL
CHOL/HDL RATIO: 6.9 ratio — AB (ref <5.0)
Cholesterol: 235 mg/dL — ABNORMAL HIGH (ref <200)
HDL: 34 mg/dL — ABNORMAL LOW (ref >50)
LDL CALC: 168 mg/dL — AB (ref <100)
Triglycerides: 165 mg/dL — ABNORMAL HIGH (ref <150)
VLDL: 33 mg/dL — ABNORMAL HIGH (ref <30)

## 2017-01-26 LAB — TSH: TSH: 2.13 m[IU]/L

## 2017-01-26 LAB — MAGNESIUM: MAGNESIUM: 1.9 mg/dL (ref 1.5–2.5)

## 2017-01-27 ENCOUNTER — Telehealth: Payer: Self-pay | Admitting: *Deleted

## 2017-01-27 DIAGNOSIS — E7849 Other hyperlipidemia: Secondary | ICD-10-CM

## 2017-01-27 LAB — HEMOGLOBIN A1C
Hgb A1c MFr Bld: 5.7 % — ABNORMAL HIGH (ref <5.7)
MEAN PLASMA GLUCOSE: 117 mg/dL

## 2017-01-27 MED ORDER — ATORVASTATIN CALCIUM 80 MG PO TABS: 80.0000 mg | ORAL_TABLET | Freq: Every day | ORAL | 3 refills | Status: DC

## 2017-01-27 NOTE — Telephone Encounter (Signed)
-----   Message from Jodelle GrossKathryn M Lawrence, NP sent at 01/27/2017  7:32 AM EDT ----- Cholesterol is elevated and will need treatment. Begin Lipitor 80 mg daily. Follow up labs in 6 weeks, with lipids and LFTs.

## 2017-03-16 ENCOUNTER — Telehealth: Payer: Self-pay | Admitting: Gastroenterology

## 2017-03-16 ENCOUNTER — Ambulatory Visit: Payer: Self-pay | Admitting: Gastroenterology

## 2017-03-16 ENCOUNTER — Encounter: Payer: Self-pay | Admitting: Gastroenterology

## 2017-03-16 NOTE — Telephone Encounter (Signed)
PATIENT WAS A NO SHOW AND LETTER SENT  °

## 2017-07-18 ENCOUNTER — Other Ambulatory Visit: Payer: Self-pay | Admitting: Cardiology

## 2018-02-01 ENCOUNTER — Other Ambulatory Visit: Payer: Self-pay

## 2018-02-01 ENCOUNTER — Encounter: Payer: Self-pay | Admitting: Cardiology

## 2018-02-01 ENCOUNTER — Ambulatory Visit (INDEPENDENT_AMBULATORY_CARE_PROVIDER_SITE_OTHER): Payer: Self-pay | Admitting: Cardiology

## 2018-02-01 VITALS — BP 120/81 | HR 73 | Ht 67.0 in | Wt 97.0 lb

## 2018-02-01 DIAGNOSIS — I5022 Chronic systolic (congestive) heart failure: Secondary | ICD-10-CM

## 2018-02-01 DIAGNOSIS — I10 Essential (primary) hypertension: Secondary | ICD-10-CM

## 2018-02-01 DIAGNOSIS — E782 Mixed hyperlipidemia: Secondary | ICD-10-CM

## 2018-02-01 MED ORDER — ATORVASTATIN CALCIUM 40 MG PO TABS: 40.0000 mg | ORAL_TABLET | Freq: Every day | ORAL | 1 refills | Status: DC

## 2018-02-01 NOTE — Patient Instructions (Signed)
Your physician wants you to follow-up in: 1 YEAR [facility] You will receive a reminder letter in the mail two months in advance. If you don't receive a letter, please call our office to schedule the follow-up appointment.  Your physician has recommended you make the following change in your medication:   DECREASE ATORVASTATIN 40  MG DAILY   Your physician recommends that you return for lab work - PLEASE FAST 6-8 HOURS PRIOR TO LAB WORK - THS/CBC/BMP/MG/LIPIDS/HGBA1C  Thank you for choosing [facility]!!
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
## 2018-03-14 ENCOUNTER — Other Ambulatory Visit: Payer: Self-pay | Admitting: Cardiology

## 2018-07-31 ENCOUNTER — Other Ambulatory Visit: Payer: Self-pay | Admitting: Cardiology

## 2018-10-25 ENCOUNTER — Other Ambulatory Visit: Payer: Self-pay | Admitting: Cardiology

## 2019-04-11 ENCOUNTER — Other Ambulatory Visit: Payer: Self-pay | Admitting: Cardiology

## 2019-05-11 ENCOUNTER — Other Ambulatory Visit: Payer: Self-pay | Admitting: Cardiology

## 2019-05-15 ENCOUNTER — Telehealth: Payer: Self-pay | Admitting: Cardiology

## 2019-05-15 NOTE — Telephone Encounter (Signed)
Virtual Visit Pre-Appointment Phone Call  "(Name), I am calling you today to discuss your upcoming appointment. We are currently trying to limit exposure to the virus that causes COVID-19 by seeing patients at home rather than in the office."  1. "What is the BEST phone number to call the day of the visit?" - include this in appointment notes  2. Do you have or have access to (through a family member/friend) a smartphone with video capability that we can use for your visit?" a. If yes - list this number in appt notes as cell (if different from BEST phone #) and list the appointment type as a VIDEO visit in appointment notes b. If no - list the appointment type as a PHONE visit in appointment notes  3. Confirm consent - "In the setting of the current Covid19 crisis, you are scheduled for a (phone or video) visit with your provider on (date) at (time).  Just as we do with many in-office visits, in order for you to participate in this visit, we must obtain consent.  If you'd like, I can send this to your mychart (if signed up) or email for you to review.  Otherwise, I can obtain your verbal consent now.  All virtual visits are billed to your insurance company just like a normal visit would be.  By agreeing to a virtual visit, we'd like you to understand that the technology does not allow for your provider to perform an examination, and thus may limit your provider's ability to fully assess your condition. If your provider identifies any concerns that need to be evaluated in person, we will make arrangements to do so.  Finally, though the technology is pretty good, we cannot assure that it will always work on either your or our end, and in the setting of a video visit, we may have to convert it to a phone-only visit.  In either situation, we cannot ensure that we have a secure connection.  Are you willing to proceed?" STAFF: Did the patient verbally acknowledge consent to telehealth visit? Document  YES/NO here: yes  4. Advise patient to be prepared - "Two hours prior to your appointment, go ahead and check your blood pressure, pulse, oxygen saturation, and your weight (if you have the equipment to check those) and write them all down. When your visit starts, your provider will ask you for this information. If you have an Apple Watch or Kardia device, please plan to have heart rate information ready on the day of your appointment. Please have a pen and paper handy nearby the day of the visit as well."  5. Give patient instructions for MyChart download to smartphone OR [email or web address] as below if video visit (depending on what platform provider is using)  6. Inform patient they will receive a phone call 15 minutes prior to their appointment time (may be from unknown caller ID) so they should be prepared to answer    TELEPHONE CALL NOTE  Ashley Arroyo has been deemed a candidate for a follow-up tele-health visit to limit community exposure during the Covid-19 pandemic. I spoke with the patient via phone to ensure availability of phone/video source, confirm preferred email & phone number, and discuss instructions and expectations.  I reminded Ashley Arroyo to be prepared with any vital sign and/or heart rhythm information that could potentially be obtained via home monitoring, at the time of her visit. I reminded Ashley Arroyo to expect a phone call prior to  her visit.  Geraldine Contras 05/15/2019 2:07 PM   INSTRUCTIONS FOR DOWNLOADING THE MYCHART APP TO SMARTPHONE  - The patient must first make sure to have activated MyChart and know their login information - If Apple, go to [employer] and type in MyChart in the search bar and download the app. If Android, ask patient to go to [employer] and type in [place] in the search bar and download the app. The app is free but as with any other app downloads, their phone may require them to verify saved payment information or  Apple/Android password.  - The patient will need to then log into the app with their MyChart username and password, and select [facility] as their healthcare provider to link the account. When it is time for your visit, go to the MyChart app, find appointments, and click Begin Video Visit. Be sure to Select Allow for your device to access the Microphone and Camera for your visit. You will then be connected, and your provider will be with you shortly.  **If they have any issues connecting, or need assistance please contact MyChart service desk [phone])**  **If using a computer, in order to ensure the best quality for their visit they will need to use either of the following Internet Browsers: [employer] or Google Chrome**  IF USING DOXIMITY or DOXY.ME - The patient will receive a link just prior to their visit by text.     FULL LENGTH CONSENT FOR TELE-HEALTH VISIT   I hereby voluntarily request, consent and authorize [facility] and its employed or contracted physicians, physician assistants, nurse practitioners or other licensed health care professionals (the Practitioner), to provide me with telemedicine health care services (the Services") as deemed necessary by the treating Practitioner. I acknowledge and consent to receive the Services by the Practitioner via telemedicine. I understand that the telemedicine visit will involve communicating with the Practitioner through live audiovisual communication technology and the disclosure of certain medical information by electronic transmission. I acknowledge that I have been given the opportunity to request an in-person assessment or other available alternative prior to the telemedicine visit and am voluntarily participating in the telemedicine visit.  I understand that I have the right to withhold or withdraw my consent to the use of telemedicine in the course of my care at any time, without affecting my right to future care  or treatment, and that the Practitioner or I may terminate the telemedicine visit at any time. I understand that I have the right to inspect all information obtained and/or recorded in the course of the telemedicine visit and may receive copies of available information for a reasonable fee.  I understand that some of the potential risks of receiving the Services via telemedicine include:   Delay or interruption in medical evaluation due to technological equipment failure or disruption;  Information transmitted may not be sufficient (e.g. poor resolution of images) to allow for appropriate medical decision making by the Practitioner; and/or   In rare instances, security protocols could fail, causing a breach of personal health information.  Furthermore, I acknowledge that it is my responsibility to provide information about my medical history, conditions and care that is complete and accurate to the best of my ability. I acknowledge that Practitioner's advice, recommendations, and/or decision may be based on factors not within their control, such as incomplete or inaccurate data provided by me or distortions of diagnostic images or specimens that may result from electronic transmissions. I  understand that the practice of medicine is not an exact science and that Practitioner makes no warranties or guarantees regarding treatment outcomes. I acknowledge that I will receive a copy of this consent concurrently upon execution via email to the email address I last provided but may also request a printed copy by calling the office of [place].    I understand that my insurance will be billed for this visit.   I have read or had this consent read to me.  I understand the contents of this consent, which adequately explains the benefits and risks of the Services being provided via telemedicine.   I have been provided ample opportunity to ask questions regarding this consent and the Services and have had  my questions answered to my satisfaction.  I give my informed consent for the services to be provided through the use of telemedicine in my medical care  By participating in this telemedicine visit I agree to the above.

## 2019-05-18 ENCOUNTER — Encounter: Payer: Self-pay | Admitting: Cardiology

## 2019-05-18 ENCOUNTER — Telehealth (INDEPENDENT_AMBULATORY_CARE_PROVIDER_SITE_OTHER): Payer: Self-pay | Admitting: Cardiology

## 2019-05-18 VITALS — BP 115/84 | HR 78 | Ht 67.0 in | Wt 92.0 lb

## 2019-05-18 DIAGNOSIS — E782 Mixed hyperlipidemia: Secondary | ICD-10-CM

## 2019-05-18 DIAGNOSIS — I10 Essential (primary) hypertension: Secondary | ICD-10-CM

## 2019-05-18 DIAGNOSIS — I5022 Chronic systolic (congestive) heart failure: Secondary | ICD-10-CM

## 2019-05-18 MED ORDER — SIMVASTATIN 40 MG PO TABS: 40.0000 mg | ORAL_TABLET | Freq: Every day | ORAL | 1 refills | Status: DC

## 2019-05-18 NOTE — Patient Instructions (Signed)
Your physician wants you to follow-up in: [place] will receive a reminder letter in the mail two months in advance. If you don't receive a letter, please call our office to schedule the follow-up appointment.  Your physician has recommended you make the following change in your medication:   START SIMVASTATIN 40 MG DAILY   Thank you for choosing [facility]!!
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
## 2019-05-26 ENCOUNTER — Other Ambulatory Visit: Payer: Self-pay | Admitting: Cardiology

## 2019-05-28 ENCOUNTER — Other Ambulatory Visit: Payer: Self-pay | Admitting: *Deleted

## 2019-05-28 MED ORDER — LISINOPRIL 20 MG PO TABS: 20.0000 mg | ORAL_TABLET | Freq: Every day | ORAL | 3 refills | Status: DC

## 2019-10-16 ENCOUNTER — Other Ambulatory Visit: Payer: Self-pay | Admitting: Cardiology

## 2019-12-06 ENCOUNTER — Telehealth: Payer: Self-pay | Admitting: Cardiology

## 2019-12-06 NOTE — Telephone Encounter (Signed)
  Patient Consent for Virtual Visit         [name] has provided verbal consent on 12/06/2019 for a virtual visit (video or telephone).   CONSENT FOR VIRTUAL VISIT FOR:  Ashley Arroyo  By participating in this virtual visit I agree to the following:  I hereby voluntarily request, consent and authorize [facility] and its employed or contracted physicians, physician assistants, nurse practitioners or other licensed health care professionals (the Practitioner), to provide me with telemedicine health care services (the "Services") as deemed necessary by the treating Practitioner. I acknowledge and consent to receive the Services by the Practitioner via telemedicine. I understand that the telemedicine visit will involve communicating with the Practitioner through live audiovisual communication technology and the disclosure of certain medical information by electronic transmission. I acknowledge that I have been given the opportunity to request an in-person assessment or other available alternative prior to the telemedicine visit and am voluntarily participating in the telemedicine visit.  I understand that I have the right to withhold or withdraw my consent to the use of telemedicine in the course of my care at any time, without affecting my right to future care or treatment, and that the Practitioner or I may terminate the telemedicine visit at any time. I understand that I have the right to inspect all information obtained and/or recorded in the course of the telemedicine visit and may receive copies of available information for a reasonable fee.  I understand that some of the potential risks of receiving the Services via telemedicine include:  [name] Delay or interruption in medical evaluation due to technological equipment failure or disruption; . Information transmitted may not be sufficient (e.g. poor resolution of images) to allow for appropriate medical decision making by the  Practitioner; and/or  . In rare instances, security protocols could fail, causing a breach of personal health information.  Furthermore, I acknowledge that it is my responsibility to provide information about my medical history, conditions and care that is complete and accurate to the best of my ability. I acknowledge that Practitioner's advice, recommendations, and/or decision may be based on factors not within their control, such as incomplete or inaccurate data provided by me or distortions of diagnostic images or specimens that may result from electronic transmissions. I understand that the practice of medicine is not an exact science and that Practitioner makes no warranties or guarantees regarding treatment outcomes. I acknowledge that a copy of this consent can be made available to me via my patient portal [facility] MyChart), or I can request a printed copy by calling the office of [facility].    I understand that my insurance will be billed for this visit.   I have read or had this consent read to me. . I understand the contents of this consent, which adequately explains the benefits and risks of the Services being provided via telemedicine.  . I have been provided ample opportunity to ask questions regarding this consent and the Services and have had my questions answered to my satisfaction. . I give my informed consent for the services to be provided through the use of telemedicine in my medical care

## 2019-12-25 ENCOUNTER — Encounter: Payer: Self-pay | Admitting: Cardiology

## 2019-12-25 ENCOUNTER — Telehealth (INDEPENDENT_AMBULATORY_CARE_PROVIDER_SITE_OTHER): Payer: Self-pay | Admitting: Cardiology

## 2019-12-25 VITALS — BP 154/117 | HR 79 | Ht 67.0 in | Wt 96.0 lb

## 2019-12-25 DIAGNOSIS — Z8679 Personal history of other diseases of the circulatory system: Secondary | ICD-10-CM

## 2019-12-25 DIAGNOSIS — I10 Essential (primary) hypertension: Secondary | ICD-10-CM

## 2019-12-25 DIAGNOSIS — E782 Mixed hyperlipidemia: Secondary | ICD-10-CM

## 2019-12-25 MED ORDER — SIMVASTATIN 40 MG PO TABS
40.0000 mg | ORAL_TABLET | Freq: Every day | ORAL | 1 refills | Status: DC
Start: 2019-12-25 — End: 2021-05-05

## 2019-12-25 NOTE — Addendum Note (Signed)
Addended by: Burman Nieves T on: 12/25/2019 09:04 AM   Modules accepted: Orders

## 2019-12-25 NOTE — Patient Instructions (Signed)
Your physician wants you to follow-up in: 6 MONTHS WITH DR [facility] You will receive a reminder letter in the mail two months in advance. If you don't receive a letter, please call our office to schedule the follow-up appointment.  Your physician has recommended you make the following change in your medication:   START SIMVASTATIN 40 MG DAILY   Your physician recommends that you return for lab work CBC/BMP/MG/TSH/LIPIDS/HGBA1C - PLEASE FAST 6-8 HOURS PRIOR TO LAB WORK   Thank you for choosing [facility]!!
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
## 2020-01-21 ENCOUNTER — Other Ambulatory Visit: Payer: Self-pay | Admitting: Cardiology

## 2020-04-19 ENCOUNTER — Other Ambulatory Visit: Payer: Self-pay | Admitting: Cardiology

## 2020-05-13 ENCOUNTER — Other Ambulatory Visit: Payer: Self-pay

## 2020-05-13 ENCOUNTER — Emergency Department (HOSPITAL_COMMUNITY): Payer: Self-pay

## 2020-05-13 ENCOUNTER — Encounter (HOSPITAL_COMMUNITY): Payer: Self-pay | Admitting: *Deleted

## 2020-05-13 DIAGNOSIS — M25512 Pain in left shoulder: Secondary | ICD-10-CM | POA: Insufficient documentation

## 2020-05-13 DIAGNOSIS — Z7982 Long term (current) use of aspirin: Secondary | ICD-10-CM | POA: Insufficient documentation

## 2020-05-13 DIAGNOSIS — I10 Essential (primary) hypertension: Secondary | ICD-10-CM | POA: Insufficient documentation

## 2020-05-13 DIAGNOSIS — F17210 Nicotine dependence, cigarettes, uncomplicated: Secondary | ICD-10-CM | POA: Insufficient documentation

## 2020-05-13 DIAGNOSIS — Z79899 Other long term (current) drug therapy: Secondary | ICD-10-CM | POA: Insufficient documentation

## 2020-05-13 DIAGNOSIS — Z955 Presence of coronary angioplasty implant and graft: Secondary | ICD-10-CM | POA: Insufficient documentation

## 2020-05-13 NOTE — ED Triage Notes (Signed)
Pt with left shoulder pain off and on for "a long tim" but for past few weeks pain has increased.  Pt states at times with sharp pain with movement.

## 2020-05-14 ENCOUNTER — Emergency Department (HOSPITAL_COMMUNITY)
Admission: EM | Admit: 2020-05-14 | Discharge: 2020-05-14 | Disposition: A | Discharge status: Home / Self Care | Payer: Self-pay | Attending: Emergency Medicine | Admitting: Emergency Medicine

## 2020-05-14 DIAGNOSIS — M25512 Pain in left shoulder: Secondary | ICD-10-CM
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
## 2020-05-14 NOTE — Discharge Instructions (Addendum)
Apply ice for 30 minutes at a time, 4 times a day.  Take anti-inflammatory medication for your shoulder. You may either take ibuprofen 200 mg tablets-3 tablets at a time 4 times a day, or 4 tablets at a time 3 times a day. Alternatively, you can take 2 naproxen tablets at a time twice a day.  You may take acetaminophen as needed for additional pain relief.  Follow-up with the orthopedic doctor for further evaluation and treatment.

## 2020-07-01 ENCOUNTER — Ambulatory Visit: Payer: Self-pay | Admitting: Cardiology
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
## 2020-07-17 ENCOUNTER — Other Ambulatory Visit: Payer: Self-pay | Admitting: Cardiology

## 2020-10-27 ENCOUNTER — Other Ambulatory Visit: Payer: Self-pay | Admitting: Cardiology

## 2021-01-12 ENCOUNTER — Other Ambulatory Visit: Payer: Self-pay | Admitting: Cardiology

## 2021-02-21 ENCOUNTER — Encounter: Payer: Self-pay | Admitting: Cardiology

## 2021-03-23 ENCOUNTER — Other Ambulatory Visit: Payer: Self-pay | Admitting: *Deleted

## 2021-03-23 MED ORDER — CARVEDILOL 25 MG PO TABS: 25.0000 mg | ORAL_TABLET | Freq: Two times a day (BID) | ORAL | 0 refills | Status: DC

## 2021-03-23 MED ORDER — LISINOPRIL 20 MG PO TABS: 20.0000 mg | ORAL_TABLET | Freq: Every day | ORAL | 0 refills | Status: DC

## 2021-05-04 ENCOUNTER — Encounter: Payer: Self-pay | Admitting: *Deleted

## 2021-05-05 ENCOUNTER — Encounter: Payer: Self-pay | Admitting: Cardiology

## 2021-05-05 ENCOUNTER — Ambulatory Visit (INDEPENDENT_AMBULATORY_CARE_PROVIDER_SITE_OTHER): Payer: Commercial Managed Care - PPO | Admitting: Cardiology

## 2021-05-05 VITALS — BP 120/70 | HR 69 | Ht 67.0 in | Wt 104.0 lb

## 2021-05-05 DIAGNOSIS — E782 Mixed hyperlipidemia: Secondary | ICD-10-CM

## 2021-05-05 DIAGNOSIS — I10 Essential (primary) hypertension: Secondary | ICD-10-CM

## 2021-05-05 DIAGNOSIS — Z8679 Personal history of other diseases of the circulatory system: Secondary | ICD-10-CM

## 2021-05-05 MED ORDER — SIMVASTATIN 40 MG PO TABS: 40.0000 mg | ORAL_TABLET | Freq: Every evening | ORAL | 3 refills | Status: DC
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
## 2021-05-05 NOTE — Patient Instructions (Signed)
Medication Instructions:  Continue all current medications.  Labwork: BMET, FLP - orders given today.  Reminder:  Nothing to eat or drink after 12 midnight prior to labs. Office will contact with results via phone or letter.     Testing/Procedures: none  Follow-Up: 6 months   Any Other Special Instructions Will Be Listed Below (If Applicable).   If you need a refill on your cardiac medications before your next appointment, please call your pharmacy.

## 2021-06-30 ENCOUNTER — Other Ambulatory Visit: Payer: Self-pay | Admitting: *Deleted

## 2021-06-30 MED ORDER — ALBUTEROL SULFATE HFA 108 (90 BASE) MCG/ACT IN AERS: 1.0000 | INHALATION_SPRAY | RESPIRATORY_TRACT | 1 refills | Status: AC | PRN

## 2021-07-11 ENCOUNTER — Other Ambulatory Visit: Payer: Self-pay | Admitting: Cardiology

## 2021-08-10 DIAGNOSIS — E782 Mixed hyperlipidemia: Secondary | ICD-10-CM | POA: Diagnosis not present

## 2021-08-19 ENCOUNTER — Encounter: Payer: Self-pay | Admitting: *Deleted

## 2021-08-19 ENCOUNTER — Telehealth: Payer: Self-pay | Admitting: *Deleted

## 2021-08-19 NOTE — Telephone Encounter (Signed)
-----   Message from Arnoldo Lenis, MD sent at 08/12/2021  3:28 PM EST ----- Labs look good, no changes  Zandra Abts MD

## 2021-08-19 NOTE — Telephone Encounter (Signed)
Laurine Blazer, LPN  [id number]  [id number] PM EST Back to Top    Patient notified via letter.

## 2021-10-05 ENCOUNTER — Other Ambulatory Visit: Payer: Self-pay | Admitting: Cardiology

## 2021-10-30 IMAGING — DX DG SHOULDER 2+V*L*
3 series · 3 of 3 positions shown · non-contrast
Comparison: None.

CLINICAL DATA: Left shoulder pain

EXAM:
LEFT SHOULDER - 2+ VIEW

[shoulder grashey]
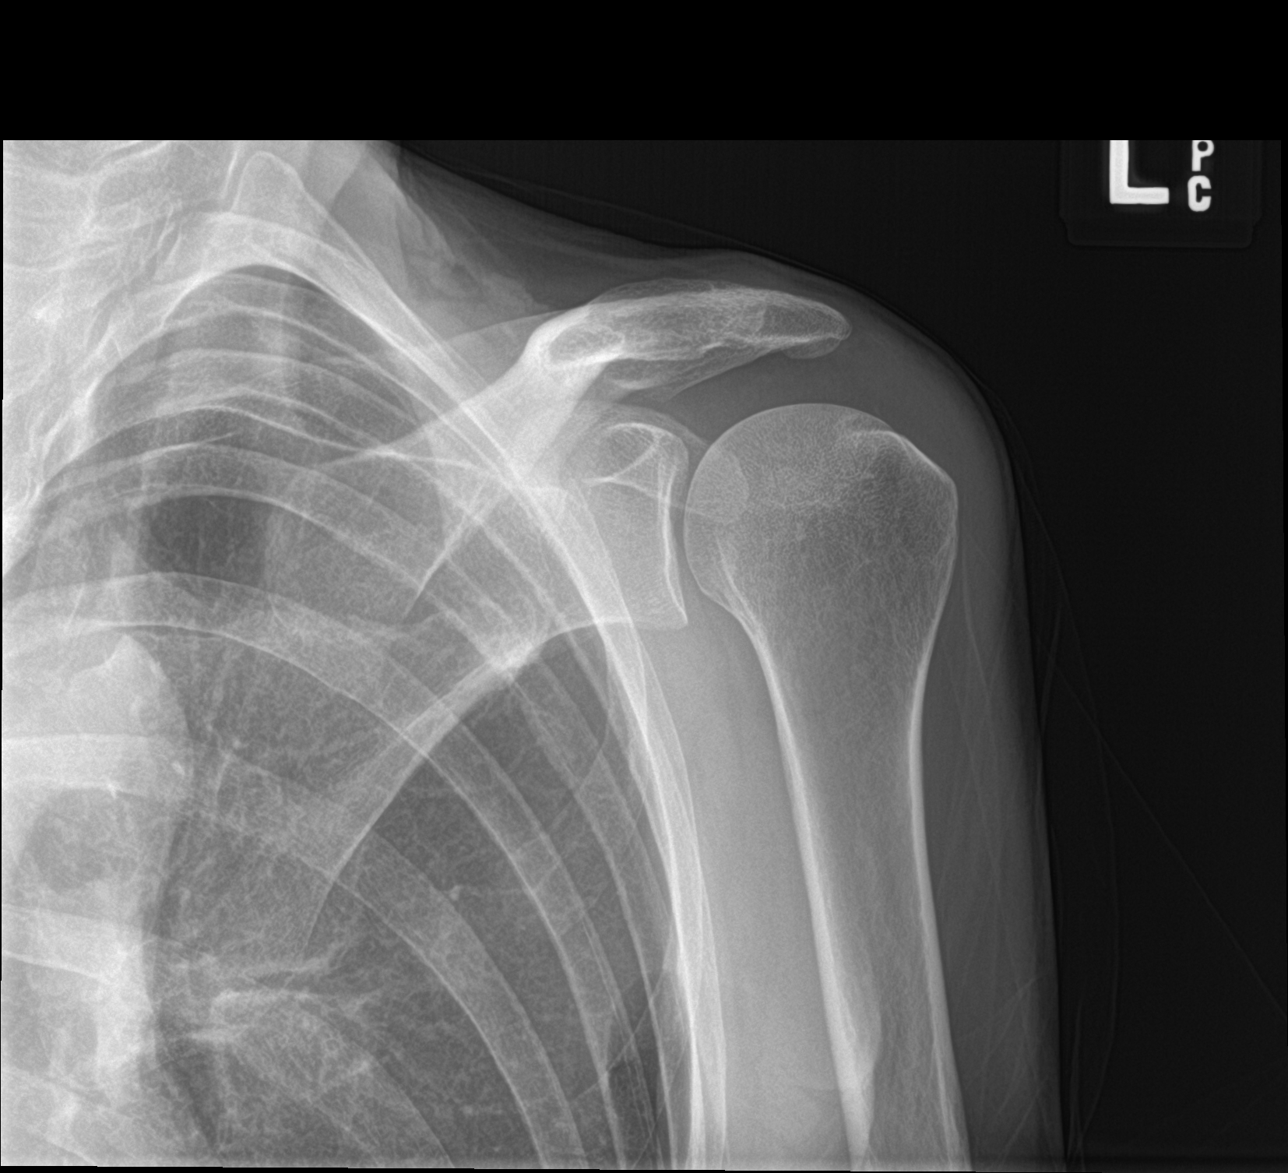

[shoulder y view]
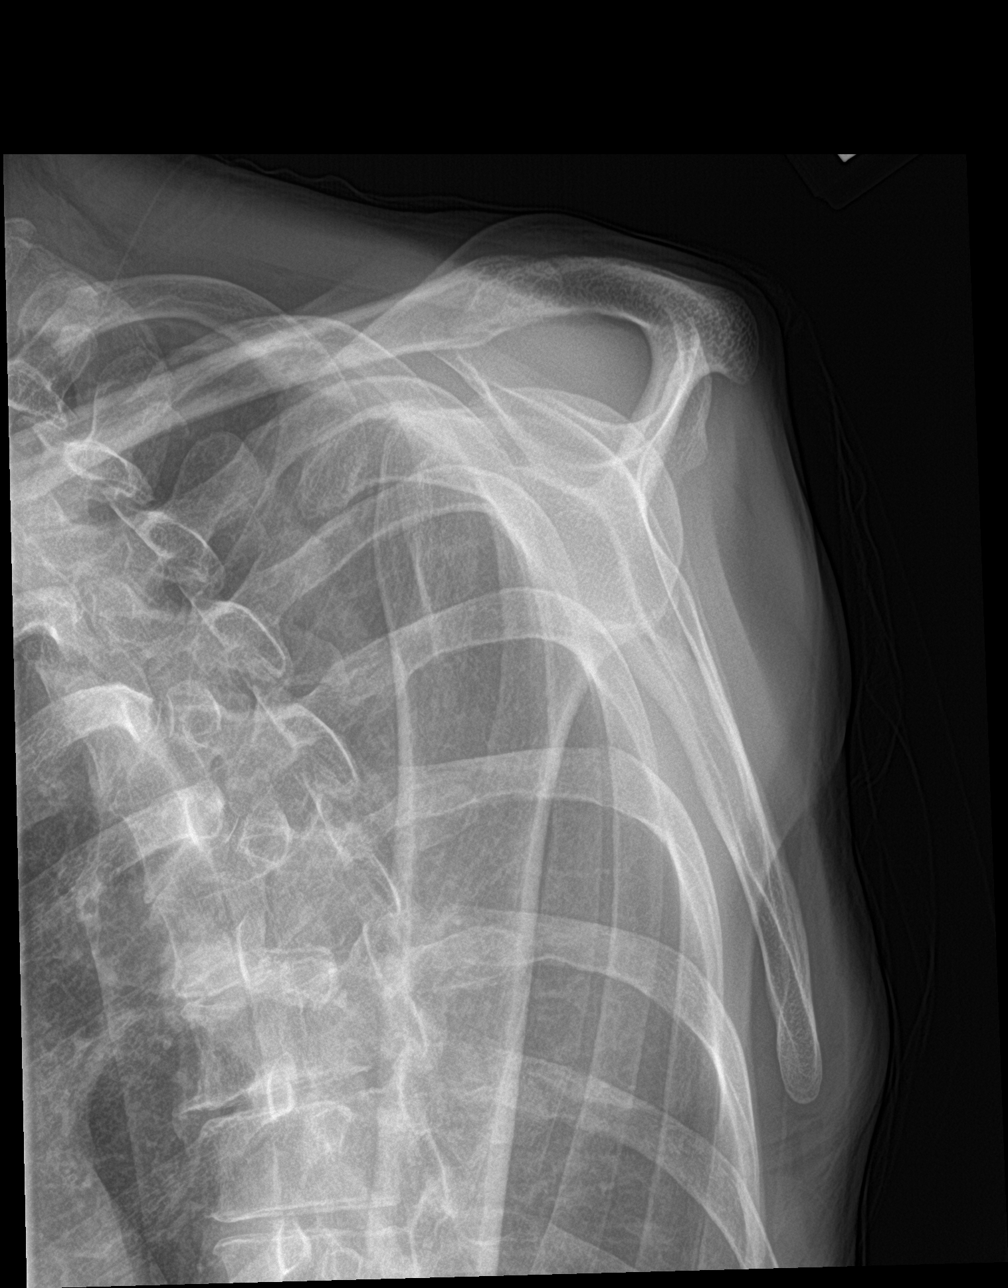

[shoulder axillary]
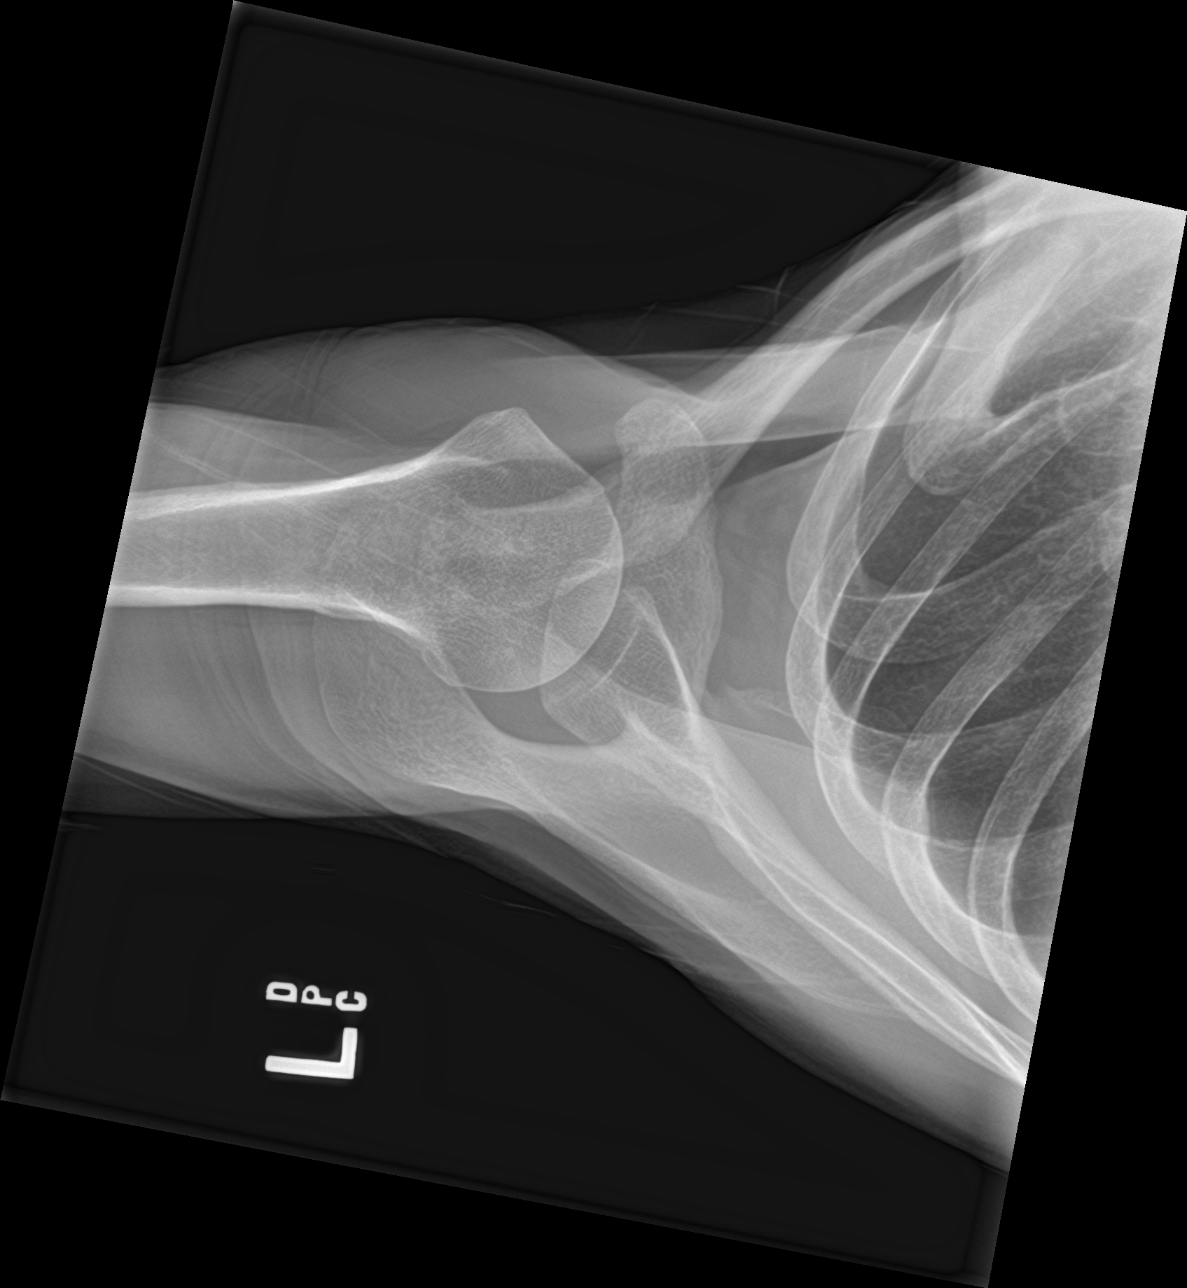

[3 of 3 positions shown; findings below may reference images not displayed]

FINDINGS: Three view radiograph right shoulder demonstrates normal alignment.
No fracture or dislocation. The acromioclavicular joint space is not
well profiled. The glenohumeral joint space is preserved. Limited
evaluation of the left hemithorax is unremarkable.
IMPRESSION: Normal examination.

## 2022-02-12 ENCOUNTER — Ambulatory Visit: Payer: Commercial Managed Care - PPO | Admitting: Cardiology

## 2022-02-12 ENCOUNTER — Encounter: Payer: Self-pay | Admitting: Cardiology
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
## 2022-04-27 ENCOUNTER — Other Ambulatory Visit: Payer: Self-pay | Admitting: Cardiology

## 2022-05-27 ENCOUNTER — Other Ambulatory Visit: Payer: Self-pay | Admitting: Cardiology

## 2022-06-21 ENCOUNTER — Other Ambulatory Visit: Payer: Self-pay | Admitting: Cardiology

## 2022-08-04 ENCOUNTER — Telehealth: Payer: Self-pay | Admitting: Cardiology

## 2022-08-04 MED ORDER — LISINOPRIL 20 MG PO TABS: 20.0000 mg | ORAL_TABLET | Freq: Every day | ORAL | 0 refills | Status: DC

## 2022-08-04 NOTE — Telephone Encounter (Signed)
Completed.

## 2022-08-04 NOTE — Telephone Encounter (Signed)
*  STAT* If patient is at the pharmacy, call can be transferred to refill team.   1. Which medications need to be refilled? (please list name of each medication and dose if known)   lisinopril (ZESTRIL) 20 MG tablet   2. Which pharmacy/location (including street and city if local pharmacy) is medication to be sent to?  [facility], [place]   3. Do they need a 30 day or 90 day supply? 30 day  Patient stated she has enough medication for 5 days.  Patient has appointment scheduled on 1/11.

## 2022-08-12 ENCOUNTER — Ambulatory Visit: Payer: BC Managed Care – PPO | Attending: Cardiology | Admitting: Cardiology

## 2022-08-12 VITALS — BP 145/90 | HR 67 | Ht 67.0 in | Wt 96.6 lb

## 2022-08-12 DIAGNOSIS — Z8679 Personal history of other diseases of the circulatory system: Secondary | ICD-10-CM

## 2022-08-12 DIAGNOSIS — I10 Essential (primary) hypertension: Secondary | ICD-10-CM | POA: Diagnosis not present

## 2022-08-12 DIAGNOSIS — E782 Mixed hyperlipidemia: Secondary | ICD-10-CM

## 2022-08-12 DIAGNOSIS — Z79899 Other long term (current) drug therapy: Secondary | ICD-10-CM

## 2022-08-12 MED ORDER — LISINOPRIL 20 MG PO TABS: 20.0000 mg | ORAL_TABLET | Freq: Every day | ORAL | 3 refills | Status: DC

## 2022-08-12 MED ORDER — SIMVASTATIN 40 MG PO TABS: 40.0000 mg | ORAL_TABLET | Freq: Every day | ORAL | 3 refills | Status: DC
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
## 2022-08-12 NOTE — Patient Instructions (Addendum)
Medication Instructions:  Lisinopril & Simvastatin refilled today  Continue all other medications.     Labwork: CMET, TSH, Mg, HgbA1c, FLP, CBC - orders given today  Reminder:  Nothing to eat or drink after 12 midnight prior to labs. Office will contact with results via phone, letter or mychart.     Testing/Procedures: none  Follow-Up: Your physician wants you to follow up in:  1 year.  You should receive a call from the office when due.  If you don't receive this call, please call our office to schedule the follow up appointment    Any Other Special Instructions Will Be Listed Below (If Applicable).   If you need a refill on your cardiac medications before your next appointment, please call your pharmacy.

## 2022-10-30 ENCOUNTER — Other Ambulatory Visit: Payer: Self-pay | Admitting: Cardiology

## 2023-07-26 ENCOUNTER — Other Ambulatory Visit: Payer: Self-pay | Admitting: Cardiology

## 2023-09-02 ENCOUNTER — Ambulatory Visit: Payer: BC Managed Care – PPO | Attending: Nurse Practitioner | Admitting: Nurse Practitioner

## 2023-09-02 NOTE — Progress Notes (Deleted)
  Cardiology Office Note:  .   Date:  09/02/2023  ID:  Ashley Arroyo, DOB 03/31/1961, MRN [id number] PCP: Aviva Kluver  [facility] Providers Cardiologist:  Dina Rich, MD { Click to update primary MD,subspecialty MD or APP then REFRESH:1}   History of Present Illness: .   Ashley Arroyo is a 63 y.o. female with a PMH of CAD, cardiomyopathy, hypertension, hyperlipidemia, COPD, who presents today for 1 year follow-up.  Past cardiac history of suspected Takotsubo cardiomyopathy in 2013, EF was 25%, LVEF in 2016 was 60 to 65%.  Cardiac catheterization revealed mild CAD.  Last seen by Dr. Dina Rich on Dorene Sorrow 06/22/2023.  Blood pressure was elevated in the office as she had ran out of lisinopril, this was refilled.  Overall she was doing well at the time.  ROS: ***  Studies Reviewed: .        *** Risk Assessment/Calculations:   {Does this patient have ATRIAL FIBRILLATION?:[id number]} No BP recorded.  {Refresh Note OR Click here to enter BP  :1}***       Physical Exam:   VS:  There were no vitals taken for this visit.   Wt Readings from Last 3 Encounters:  08/12/22 96 lb 9.6 oz (43.8 kg)  05/05/21 104 lb (47.2 kg)  05/13/20 92 lb (41.7 kg)    GEN: Well nourished, well developed in no acute distress NECK: No JVD; No carotid bruits CARDIAC: ***RRR, no murmurs, rubs, gallops RESPIRATORY:  Clear to auscultation without rales, wheezing or rhonchi  ABDOMEN: Soft, non-tender, non-distended EXTREMITIES:  No edema; No deformity   ASSESSMENT AND PLAN: .   ***    {Are you ordering a CV Procedure (e.g. stress test, cath, DCCV, TEE, etc)?   Press F2        :[id number]}  Dispo: ***  Signed, Sharlene Dory, NP

## 2023-10-24 ENCOUNTER — Other Ambulatory Visit: Payer: Self-pay | Admitting: Cardiology

## 2023-11-04 ENCOUNTER — Encounter: Payer: Self-pay | Admitting: Nurse Practitioner

## 2023-11-04 ENCOUNTER — Ambulatory Visit: Attending: Nurse Practitioner | Admitting: Nurse Practitioner

## 2023-11-04 VITALS — BP 144/80 | HR 81 | Ht 67.0 in | Wt 94.8 lb

## 2023-11-04 DIAGNOSIS — Z8679 Personal history of other diseases of the circulatory system: Secondary | ICD-10-CM

## 2023-11-04 DIAGNOSIS — E785 Hyperlipidemia, unspecified: Secondary | ICD-10-CM | POA: Diagnosis not present

## 2023-11-04 DIAGNOSIS — Z720 Tobacco use: Secondary | ICD-10-CM

## 2023-11-04 DIAGNOSIS — I10 Essential (primary) hypertension: Secondary | ICD-10-CM | POA: Diagnosis not present

## 2023-11-04 DIAGNOSIS — I2510 Atherosclerotic heart disease of native coronary artery without angina pectoris: Secondary | ICD-10-CM

## 2023-11-04 DIAGNOSIS — Z131 Encounter for screening for diabetes mellitus: Secondary | ICD-10-CM | POA: Diagnosis not present

## 2023-11-04 DIAGNOSIS — R636 Underweight: Secondary | ICD-10-CM

## 2023-11-04 DIAGNOSIS — J449 Chronic obstructive pulmonary disease, unspecified: Secondary | ICD-10-CM | POA: Diagnosis not present

## 2023-11-04 DIAGNOSIS — Z758 Other problems related to medical facilities and other health care: Secondary | ICD-10-CM

## 2023-11-04 MED ORDER — BLOOD PRESSURE MONITOR DEVI: 1.0000 | Freq: Every day | 0 refills | Status: AC

## 2023-11-04 NOTE — Patient Instructions (Addendum)
 Medication Instructions:  Your physician recommends that you continue on your current medications as directed. Please refer to the Current Medication list given to you today.  Labwork: In 1-2 weeks at [facility]   Testing/Procedures: None   Follow-Up: Your physician recommends that you schedule a follow-up appointment in:  1 Month Nurse visit for Blood Pressure check 6 months   Any Other Special Instructions Will Be Listed Below (If Applicable).     Mediterranean Diet  Why follow it? Research shows. Those who follow the Mediterranean diet have a reduced risk of heart disease  The diet is associated with a reduced incidence of Parkinson's and Alzheimer's diseases People following the diet may have longer life expectancies and lower rates of chronic diseases  The Dietary Guidelines for Americans recommends the Mediterranean diet as an eating plan to promote health and prevent disease  What Is the Mediterranean Diet?  Healthy eating plan based on typical foods and recipes of Mediterranean-style cooking The diet is primarily a plant based diet; these foods should make up a majority of meals   Starches - Plant based foods should make up a majority of meals - They are an important sources of vitamins, minerals, energy, antioxidants, and fiber - Choose whole grains, foods high in fiber and minimally processed items  - Typical grain sources include wheat, oats, barley, corn, brown rice, bulgar, farro, millet, polenta, couscous  - Various types of beans include chickpeas, lentils, fava beans, black beans, white beans   Fruits  Veggies - Large quantities of antioxidant rich fruits & veggies; 6 or more servings  - Vegetables can be eaten raw or lightly drizzled with oil and cooked  - Vegetables common to the traditional Mediterranean Diet include: artichokes, arugula, beets, broccoli, brussel sprouts, cabbage, carrots, celery, collard greens, cucumbers, eggplant, kale, leeks, lemons, lettuce,  mushrooms, okra, onions, peas, peppers, potatoes, pumpkin, radishes, rutabaga, shallots, spinach, sweet potatoes, turnips, zucchini - Fruits common to the Mediterranean Diet include: apples, apricots, avocados, cherries, clementines, dates, figs, grapefruits, grapes, melons, nectarines, oranges, peaches, pears, pomegranates, strawberries, tangerines  Fats - Replace butter and margarine with healthy oils, such as olive oil, canola oil, and tahini  - Limit nuts to no more than a handful a day  - Nuts include walnuts, almonds, pecans, pistachios, pine nuts  - Limit or avoid candied, honey roasted or heavily salted nuts - Olives are central to the [employer] - can be eaten whole or used in a variety of dishes   Meats Protein - Limiting red meat: no more than a few times a month - When eating red meat: choose lean cuts and keep the portion to the size of deck of cards - Eggs: approx. 0 to 4 times a week  - Fish and lean poultry: at least 2 a week  - Healthy protein sources include, chicken, [place], lean beef, lamb - Increase intake of seafood such as tuna, salmon, trout, mackerel, shrimp, scallops - Avoid or limit high fat processed meats such as sausage and bacon  Dairy - Include moderate amounts of low fat dairy products  - Focus on healthy dairy such as fat free yogurt, skim milk, low or reduced fat cheese - Limit dairy products higher in fat such as whole or 2% milk, cheese, ice cream  Alcohol - Moderate amounts of red wine is ok  - No more than 5 oz daily for women (all ages) and men older than age 1  - No more than 10 oz of  wine daily for men younger than 69  Other - Limit sweets and other desserts  - Use herbs and spices instead of salt to flavor foods  - Herbs and spices common to the traditional Mediterranean Diet include: basil, bay leaves, chives, cloves, cumin, fennel, garlic, lavender, marjoram, mint, oregano, parsley, pepper, rosemary, sage, savory, sumac, tarragon, thyme    It's not just a diet, it's a lifestyle:  The Mediterranean diet includes lifestyle factors typical of those in the region  Foods, drinks and meals are best eaten with others and savored Daily physical activity is important for overall good health This could be strenuous exercise like running and aerobics This could also be more leisurely activities such as walking, housework, yard-work, or taking the stairs Moderation is the key; a balanced and healthy diet accommodates most foods and drinks Consider portion sizes and frequency of consumption of certain foods   Meal Ideas & Options:  Breakfast:  Whole wheat toast or whole wheat English muffins with peanut butter & hard boiled egg Steel cut oats topped with apples & cinnamon and skim milk  Fresh fruit: banana, strawberries, melon, berries, peaches  Smoothies: strawberries, bananas, greek yogurt, peanut butter Low fat greek yogurt with blueberries and granola  Egg white omelet with spinach and mushrooms Breakfast couscous: whole wheat couscous, apricots, skim milk, cranberries  Sandwiches:  Hummus and grilled vegetables (peppers, zucchini, squash) on whole wheat bread   Grilled chicken on whole wheat pita with lettuce, tomatoes, cucumbers or tzatziki  [place] salad on whole wheat bread: tuna salad made with greek yogurt, olives, red peppers, capers, green onions Garlic rosemary lamb pita: lamb sauted with garlic, rosemary, salt & pepper; add lettuce, cucumber, greek yogurt to pita - flavor with lemon juice and black pepper  Seafood:  Mediterranean grilled salmon, seasoned with garlic, basil, parsley, lemon juice and black pepper Shrimp, lemon, and spinach whole-grain pasta salad made with low fat greek yogurt  Seared scallops with lemon orzo  Seared tuna steaks seasoned salt, pepper, coriander topped with tomato mixture of olives, tomatoes, olive oil, minced garlic, parsley, green onions and cappers  Meats:  Herbed greek chicken salad  with kalamata olives, cucumber, feta  Red bell peppers stuffed with spinach, bulgur, lean ground beef (or lentils) & topped with feta   Kebabs: skewers of chicken, tomatoes, onions, zucchini, squash  [place] burgers: made with red onions, mint, dill, lemon juice, feta cheese topped with roasted red peppers Vegetarian Cucumber salad: cucumbers, artichoke hearts, celery, red onion, feta cheese, tossed in olive oil & lemon juice  Hummus and whole grain pita points with a greek salad (lettuce, tomato, feta, olives, cucumbers, red onion) Lentil soup with celery, carrots made with vegetable broth, garlic, salt and pepper  Tabouli salad: parsley, bulgur, mint, scallions, cucumbers, tomato, radishes, lemon juice, olive oil, salt and pepper.   If you need a refill on your cardiac medications before your next appointment, please call your pharmacy.

## 2023-11-04 NOTE — Progress Notes (Addendum)
 Cardiology Office Note:  .   Date:  11/04/2023 ID:  Ashley Arroyo, DOB January 11, 1961, MRN [id number] PCP: Oneita Hurt, No  [facility] Providers Cardiologist:  Dina Rich, MD    History of Present Illness: .   Ashley Arroyo is a 63 y.o. female with a PMH of mild CAD, suspected Takotsubo cardiomyopathy in 2013 (EF 25% with improvement to 45 to 50%, heart cath that time showed nonobstructive CAD), hypertension, hyperlipidemia, COPD, and GERD, who presents today for 1 year follow-up.  TTE in 2016 showed EF 60 to 65%.  Last seen by Dr. Dina Rich on August 12, 2022.  Was doing well at that time.  Lab work was ordered.  Today she presents for 1 year follow-up.  She does admit to chronic/intermittent shortness of breath when doing levels of high exertion such as yard work.  Does not check her BP at home. Denies any chest pain, palpitations, syncope, presyncope, dizziness, orthopnea, PND, swelling or significant weight changes, acute bleeding, or claudication.  Admits to smoking 5 to 6 cigarettes/day.  Does endorse a diet high in salt.  Typically eats [employer] biscuit in the morning, has a couple coffee, eats a TV dinner around lunchtime, and has soup/Mexican meal at night.  Sometimes may go and skip meals depending on how her workday goes.  ROS: Negative. See HPI. SH: Works at [employer] in [place], [place].  Studies Reviewed: [name]    EKG:  EKG Interpretation Date/Time:  Friday November 04 2023 11:10:06 EDT Ventricular Rate:  83 PR Interval:  140 QRS Duration:  74 QT Interval:  382 QTC Calculation: 448 R Axis:   86  Text Interpretation: Normal sinus rhythm Biatrial enlargement When compared with ECG of 29-Sep-2011 06:58, T wave amplitude has increased in Inferior leads Confirmed by Sharlene Dory [phone]) on 11/04/2023 11:17:32 AM   Echo 08/2014:  Study Conclusions   - Left ventricle: The cavity size was normal. Wall thickness was    normal. Systolic function was normal. The  estimated ejection    fraction was in the range of 60% to 65%. Wall motion was normal;    there were no regional wall motion abnormalities. Left    ventricular diastolic function parameters were normal.  - Aortic valve: Mildly calcified annulus. Trileaflet; mildly    thickened leaflets. Valve area (VTI): 2.83 cm^2. Valve area    (Vmax): 2.67 cm^2. Valve area (Vmean): 2.72 cm^2.  - Mitral valve: Mildly calcified annulus. Mildly thickened leaflets    .  - Technically adequate study.  Physical Exam:   VS:  BP (!) 144/80 (BP Location: Right Arm, Patient Position: Sitting, Cuff Size: Normal)   Pulse 81   Ht 5\' 7"  (1.702 m)   Wt 94 lb 12.8 oz (43 kg)   SpO2 97%   BMI 14.85 kg/m    Wt Readings from Last 3 Encounters:  11/04/23 94 lb 12.8 oz (43 kg)  08/12/22 96 lb 9.6 oz (43.8 kg)  05/05/21 104 lb (47.2 kg)    GEN: Thin, 63 y.o. female in no acute distress NECK: No JVD; No carotid bruits CARDIAC: S1/S2, RRR, no murmurs, rubs, gallops RESPIRATORY:  Clear to auscultation without rales, wheezing or rhonchi  ABDOMEN: Soft, non-tender, non-distended EXTREMITIES:  No edema; No deformity   ASSESSMENT AND PLAN: .    Hx of CM Suspected Takotsubo cardiomyopathy in 2013 (EF 25% with improvement to 45 to 50%, heart cath that time showed nonobstructive CAD). TTE in 2016 revealed EF recovered to normal.  Euvolemic and well compensated on exam. Continue lisinopril. Low sodium diet, fluid restriction <2L, and daily weights encouraged. Educated to contact our office for weight gain of 2 lbs overnight or 5 lbs in one week.  Will obtain CMET.  CAD Heart cath in 2013 revealed mild, nonobstructive CAD. Stable with no anginal symptoms. No indication for ischemic evaluation.  Continue aspirin and lisinopril. Heart healthy diet and regular cardiovascular exercise encouraged.  Smoking cessation encouraged and discussed.   HTN SBP elevated today. Does not check BP at home.  Will write Rx for BP cuff.  Given BP  log and salty 6. Discussed to monitor BP at home at least 2 hours after medications and sitting for 5-10 minutes.  Will bring her back in 1 month for nurse visit to recheck her BP.  She will bring her BP log to this visit.  If no improvement in her BP trends, plan to increase lisinopril dosage.  Will obtain CMET, CBC.   HLD No recent labs on file.  Will obtain FLP and CMET. Heart healthy diet and regular cardiovascular exercise encouraged.   COPD Does admit to some shortness of breath with levels of high exertion, has been chronic and stable for a period of time.  Obtaining labs.  No medication changes at this time.  Referring her to PCP for further evaluation.  Tobacco abuse Smoking cessation encouraged and discussed.   Does not have a PCP Will refer her to PCP.  8. Screening for diabetes mellitus Will obtain A1c as she is due for this to be checked.   9.  Underweight Will obtain the following lab: Thyroid panel.  Referring to PCP for further evaluation.   Dispo: Follow-up with Dr. Dina Rich or APP in 6 months or sooner if any changes.  Signed, Sharlene Dory, NP

## 2023-11-21 ENCOUNTER — Other Ambulatory Visit: Payer: Self-pay | Admitting: Cardiology

## 2023-11-28 ENCOUNTER — Encounter: Payer: Self-pay | Admitting: Nurse Practitioner

## 2023-12-08 ENCOUNTER — Ambulatory Visit: Attending: Cardiology | Admitting: *Deleted

## 2023-12-08 VITALS — BP 140/96 | HR 89

## 2023-12-08 DIAGNOSIS — I10 Essential (primary) hypertension: Secondary | ICD-10-CM

## 2023-12-09 ENCOUNTER — Encounter: Payer: Self-pay | Admitting: Nurse Practitioner

## 2023-12-09 NOTE — Progress Notes (Signed)
 Patient in office for nurse BP check.  Did take her meds around 6:30 am.  Stated that she did smoke prior to coming into the office.    See also BP log scanned in for your review.

## 2023-12-13 ENCOUNTER — Telehealth: Payer: Self-pay | Admitting: Nurse Practitioner

## 2023-12-13 DIAGNOSIS — N179 Acute kidney failure, unspecified: Secondary | ICD-10-CM

## 2023-12-13 MED ORDER — LISINOPRIL 40 MG PO TABS: 40.0000 mg | ORAL_TABLET | Freq: Every day | ORAL | 1 refills | Status: DC

## 2023-12-13 NOTE — Telephone Encounter (Signed)
 Left patient a message to call back.

## 2023-12-13 NOTE — Telephone Encounter (Signed)
 Patient informed and verbalized understanding of plan. New Rx sent to pharmacy  Labs sent to [facility]  Sending for scheduling

## 2023-12-13 NOTE — Telephone Encounter (Signed)
-----   Message from Lasalle Pointer sent at 12/12/2023 10:06 AM EDT ----- Thank you for seeing her. Please increase Lisinopril  to 40 mg daily and repeat BMET in 2 weeks.   Continue to log and monitor her BP. Bring her back in 6 weeks for repeat BP check.   Thanks!   Best, Lasalle Pointer, NP ----- Message ----- From: Quentin Brunner, LPN Sent: 08/07/1094   3:56 PM EDT To: Lasalle Pointer, NP

## 2024-01-03 NOTE — Progress Notes (Signed)
 Addressed in phone call on 12/13/2023.

## 2024-01-24 ENCOUNTER — Telehealth: Payer: Self-pay

## 2024-01-24 ENCOUNTER — Ambulatory Visit: Attending: Internal Medicine

## 2024-01-24 VITALS — BP 102/62 | HR 83 | Ht 67.0 in

## 2024-01-24 DIAGNOSIS — I10 Essential (primary) hypertension: Secondary | ICD-10-CM | POA: Diagnosis not present

## 2024-01-24 NOTE — Progress Notes (Signed)
 Patient here for BP check. Patient reported no symptoms of chest pain, SOB or lightheadedness. Reported taking all medications.

## 2024-01-24 NOTE — Progress Notes (Signed)
 Blood pressure looks good today. She is at goal. No changes to treatment plan. Follow-up with me in October.   Thanks!   Best, Almarie Crate, NP

## 2024-01-24 NOTE — Telephone Encounter (Signed)
 Per Almarie:  Blood pressure looks good today. She is at goal. No changes to treatment plan. Follow-up with me in October.    Thanks!    Best, Almarie Crate, NP    Advised patient and verbalized understanding

## 2024-05-10 ENCOUNTER — Ambulatory Visit: Attending: Nurse Practitioner | Admitting: Nurse Practitioner

## 2024-05-10 ENCOUNTER — Encounter: Payer: Self-pay | Admitting: Nurse Practitioner

## 2024-05-10 ENCOUNTER — Ambulatory Visit: Admitting: Nurse Practitioner

## 2024-05-10 VITALS — BP 110/72 | HR 76 | Ht 67.0 in | Wt 93.0 lb

## 2024-05-10 DIAGNOSIS — I2510 Atherosclerotic heart disease of native coronary artery without angina pectoris: Secondary | ICD-10-CM

## 2024-05-10 DIAGNOSIS — E785 Hyperlipidemia, unspecified: Secondary | ICD-10-CM

## 2024-05-10 DIAGNOSIS — I10 Essential (primary) hypertension: Secondary | ICD-10-CM

## 2024-05-10 DIAGNOSIS — J449 Chronic obstructive pulmonary disease, unspecified: Secondary | ICD-10-CM | POA: Diagnosis not present

## 2024-05-10 DIAGNOSIS — Z8679 Personal history of other diseases of the circulatory system: Secondary | ICD-10-CM

## 2024-05-10 DIAGNOSIS — Z720 Tobacco use: Secondary | ICD-10-CM

## 2024-05-10 DIAGNOSIS — Z758 Other problems related to medical facilities and other health care: Secondary | ICD-10-CM

## 2024-05-10 MED ORDER — LISINOPRIL 40 MG PO TABS: 40.0000 mg | ORAL_TABLET | Freq: Every day | ORAL | 3 refills | Status: AC

## 2024-05-10 MED ORDER — ROSUVASTATIN CALCIUM 5 MG PO TABS: 5.0000 mg | ORAL_TABLET | Freq: Every day | ORAL | 3 refills | Status: AC

## 2024-05-10 NOTE — Patient Instructions (Signed)
 Medication Instructions:   START Crestor 5 mg daily at dinner or bedtime  Lisinopril  was refilled today  Labwork: None today  Testing/Procedures: None today  Follow-Up: 6 months  Any Other Special Instructions Will Be Listed Below (If Applicable).  [facility] provider location sheet provided for you today  If you need a refill on your cardiac medications before your next appointment, please call your pharmacy.

## 2024-05-10 NOTE — Progress Notes (Signed)
 Cardiology Office Note:  .   Date:  05/10/2024 ID:  Ashley Arroyo, DOB 07-07-1961, MRN [id number] PCP: Freddrick, No  [facility] Providers Cardiologist:  Alvan Carrier, MD    History of Present Illness: .   Ashley Arroyo is a 63 y.o. 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  TTE in 2016 showed EF 60 to 65%.  Last seen by Dr. Carrier Alvan on August 12, 2022.  Was doing well at that time.  Lab work was ordered.  11/04/2023 - Today she presents for 1 year follow-up.  She does admit to chronic/intermittent shortness of breath when doing levels of high exertion such as yard work.  Does not check her BP at home. Denies any chest pain, palpitations, syncope, presyncope, dizziness, orthopnea, PND, swelling or significant weight changes, acute bleeding, or claudication.  Admits to smoking 5 to 6 cigarettes/day.  Does endorse a diet high in salt.  Typically eats [employer] biscuit in the morning, has a couple coffee, eats a TV dinner around lunchtime, and has soup/Mexican meal at night.  Sometimes may go and skip meals depending on how her workday goes.  05/10/2024 - Here for scheduled follow-up.  Doing well and denies any acute cardiac complaints or issues.  Not seeing a primary care provider. Denies any chest pain, shortness of breath, palpitations, syncope, presyncope, dizziness, orthopnea, PND, swelling or significant weight changes, acute bleeding, or claudication.  ROS: Negative. See HPI. SH: Works at [employer] in [place], [place] .  Studies Reviewed: SABRA    EKG: EKG is not ordered today.      Echo 08/2014:  Study Conclusions   - Left ventricle: The cavity size was normal. Wall thickness was    normal. Systolic function was normal. The estimated ejection     fraction was in the range of 60% to 65%. Wall motion was normal;    there were no regional wall motion abnormalities. Left    ventricular diastolic function parameters were normal.  - Aortic valve: Mildly calcified annulus. Trileaflet; mildly    thickened leaflets. Valve area (VTI): 2.83 cm^2. Valve area    (Vmax): 2.67 cm^2. Valve area (Vmean): 2.72 cm^2.  - Mitral valve: Mildly calcified annulus. Mildly thickened leaflets    .  - Technically adequate study.  Physical Exam:   VS:  BP 110/72 (BP Location: Right Arm, Cuff Size: Normal)   Pulse 76   Ht 5' 7 (1.702 m)   Wt 93 lb (42.2 kg)   SpO2 95%   BMI 14.57 kg/m    Wt Readings from Last 3 Encounters:  05/10/24 93 lb (42.2 kg)  11/04/23 94 lb 12.8 oz (43 kg)  08/12/22 96 lb 9.6 oz (43.8 kg)    GEN: Thin, 63 y.o. female in no acute distress NECK: No JVD; No carotid bruits CARDIAC: S1/S2, RRR, no murmurs, rubs, gallops RESPIRATORY:  Clear to auscultation without rales, wheezing or rhonchi  ABDOMEN: Soft, non-tender, non-distended EXTREMITIES:  No edema; No deformity   ASSESSMENT AND PLAN: .    Hx of CM Suspected Takotsubo cardiomyopathy in 2013 (EF 25% with improvement to 45 to 50%, heart cath that time showed nonobstructive CAD). TTE in 2016 revealed EF recovered to normal. Euvolemic and well compensated on exam. Continue lisinopril .  Will refill this medicine.  Low  sodium diet, fluid restriction <2L, and daily weights encouraged. Educated to contact our office for weight gain of 2 lbs overnight or 5 lbs in one week.    CAD Heart cath in 2013 revealed mild, nonobstructive CAD. Stable with no anginal symptoms. No indication for ischemic evaluation.  Continue aspirin  and lisinopril .  Due to her elevated ASCVD risk score, will begin rosuvastatin 5 mg daily.  Heart healthy diet and regular cardiovascular exercise encouraged.  Smoking cessation encouraged and discussed.   HTN SBP stable today. Given BP log and salty 6. Discussed to  monitor BP at home at least 2 hours after medications and sitting for 5-10 minutes.  Continue lisinopril  and will provide refill.  HLD Labs from April 2025 showed LDL of 128.  Elevated ASCVD risk score.  Will begin rosuvastatin 5 mg daily and refer her to PCP.  She will need FLP/LFT checked in 2 to 3 months.  Heart healthy diet and regular cardiovascular exercise encouraged.   COPD Does admit to some shortness of breath with levels of high exertion, has been chronic and stable for a period of time.  No medication changes at this time.  Referring her to PCP for further evaluation.  Tobacco abuse Smoking cessation encouraged and discussed.   Does not have a PCP Will refer her to PCP.   Dispo: Follow-up with Dr. Dorn Ross or APP in 6 months or sooner if any changes.  Signed, Almarie Crate, NP

## 2024-11-08 ENCOUNTER — Ambulatory Visit: Admitting: Nurse Practitioner
